# Patient Record
Sex: Female | Born: 1935 | Race: White | Hispanic: No | Marital: Married | Smoking: Never smoker
Health system: Southern US, Community
[De-identification: ages and names within clinical notes are randomized; demographics above are authoritative.]

## PROBLEM LIST (undated history)

## (undated) DIAGNOSIS — R55 Syncope and collapse: Secondary | ICD-10-CM

## (undated) DIAGNOSIS — N189 Chronic kidney disease, unspecified: Secondary | ICD-10-CM

## (undated) DIAGNOSIS — R569 Unspecified convulsions: Secondary | ICD-10-CM

## (undated) DIAGNOSIS — N183 Chronic kidney disease, stage 3 unspecified: Secondary | ICD-10-CM

## (undated) DIAGNOSIS — E119 Type 2 diabetes mellitus without complications: Secondary | ICD-10-CM

## (undated) DIAGNOSIS — I10 Essential (primary) hypertension: Secondary | ICD-10-CM

## (undated) DIAGNOSIS — R011 Cardiac murmur, unspecified: Secondary | ICD-10-CM

## (undated) DIAGNOSIS — I350 Nonrheumatic aortic (valve) stenosis: Secondary | ICD-10-CM

## (undated) DIAGNOSIS — I639 Cerebral infarction, unspecified: Secondary | ICD-10-CM

## (undated) DIAGNOSIS — I509 Heart failure, unspecified: Secondary | ICD-10-CM

## (undated) DIAGNOSIS — R32 Unspecified urinary incontinence: Secondary | ICD-10-CM

## (undated) DIAGNOSIS — E039 Hypothyroidism, unspecified: Secondary | ICD-10-CM

## (undated) HISTORY — DX: Syncope and collapse: R55

## (undated) HISTORY — DX: Type 2 diabetes mellitus without complications: E11.9

## (undated) HISTORY — DX: Cardiac murmur, unspecified: R01.1

## (undated) HISTORY — DX: Heart failure, unspecified: I50.9

## (undated) HISTORY — DX: Essential (primary) hypertension: I10

## (undated) HISTORY — DX: Cerebral infarction, unspecified: I63.9

## (undated) HISTORY — DX: Chronic kidney disease, unspecified: N18.9

---

## 1999-11-03 ENCOUNTER — Encounter: Admission: RE | Admit: 1999-11-03 | Discharge: 1999-11-03 | Payer: Self-pay | Admitting: Neurosurgery

## 1999-11-03 ENCOUNTER — Encounter: Payer: Self-pay | Admitting: Neurosurgery

## 1999-11-12 ENCOUNTER — Encounter: Payer: Self-pay | Admitting: Neurosurgery

## 1999-11-16 ENCOUNTER — Encounter: Payer: Self-pay | Admitting: Neurosurgery

## 1999-11-16 ENCOUNTER — Ambulatory Visit (HOSPITAL_COMMUNITY): Admission: RE | Admit: 1999-11-16 | Discharge: 1999-11-17 | Payer: Self-pay | Admitting: Neurosurgery

## 1999-11-29 ENCOUNTER — Encounter: Admission: RE | Admit: 1999-11-29 | Discharge: 1999-11-29 | Payer: Self-pay | Admitting: Neurosurgery

## 1999-11-29 ENCOUNTER — Encounter: Payer: Self-pay | Admitting: Neurosurgery

## 2000-02-04 ENCOUNTER — Encounter: Admission: RE | Admit: 2000-02-04 | Discharge: 2000-02-04 | Payer: Self-pay | Admitting: Neurosurgery

## 2000-02-04 ENCOUNTER — Encounter: Payer: Self-pay | Admitting: Neurosurgery

## 2000-02-28 ENCOUNTER — Encounter: Payer: Self-pay | Admitting: Neurosurgery

## 2000-02-28 ENCOUNTER — Encounter: Admission: RE | Admit: 2000-02-28 | Discharge: 2000-02-28 | Payer: Self-pay | Admitting: Neurosurgery

## 2000-04-19 ENCOUNTER — Encounter: Admission: RE | Admit: 2000-04-19 | Discharge: 2000-04-19 | Payer: Self-pay | Admitting: Neurosurgery

## 2000-04-19 ENCOUNTER — Encounter: Payer: Self-pay | Admitting: Neurosurgery

## 2000-10-13 ENCOUNTER — Encounter: Payer: Self-pay | Admitting: Neurosurgery

## 2000-10-13 ENCOUNTER — Encounter: Admission: RE | Admit: 2000-10-13 | Discharge: 2000-10-13 | Payer: Self-pay | Admitting: Neurosurgery

## 2017-03-29 DIAGNOSIS — I350 Nonrheumatic aortic (valve) stenosis: Secondary | ICD-10-CM

## 2017-03-29 DIAGNOSIS — J9601 Acute respiratory failure with hypoxia: Secondary | ICD-10-CM

## 2017-03-29 DIAGNOSIS — I480 Paroxysmal atrial fibrillation: Secondary | ICD-10-CM

## 2017-03-29 DIAGNOSIS — G40501 Epileptic seizures related to external causes, not intractable, with status epilepticus: Secondary | ICD-10-CM | POA: Diagnosis not present

## 2017-03-29 DIAGNOSIS — N3000 Acute cystitis without hematuria: Secondary | ICD-10-CM

## 2017-03-29 DIAGNOSIS — R569 Unspecified convulsions: Secondary | ICD-10-CM | POA: Diagnosis not present

## 2017-03-30 DIAGNOSIS — G40501 Epileptic seizures related to external causes, not intractable, with status epilepticus: Secondary | ICD-10-CM | POA: Diagnosis not present

## 2017-03-30 DIAGNOSIS — N3000 Acute cystitis without hematuria: Secondary | ICD-10-CM | POA: Diagnosis not present

## 2017-03-30 DIAGNOSIS — J9601 Acute respiratory failure with hypoxia: Secondary | ICD-10-CM | POA: Diagnosis not present

## 2017-03-30 DIAGNOSIS — I4891 Unspecified atrial fibrillation: Secondary | ICD-10-CM | POA: Diagnosis not present

## 2017-03-30 DIAGNOSIS — I350 Nonrheumatic aortic (valve) stenosis: Secondary | ICD-10-CM | POA: Diagnosis not present

## 2017-03-30 DIAGNOSIS — I480 Paroxysmal atrial fibrillation: Secondary | ICD-10-CM | POA: Diagnosis not present

## 2017-03-30 DIAGNOSIS — I359 Nonrheumatic aortic valve disorder, unspecified: Secondary | ICD-10-CM | POA: Diagnosis not present

## 2017-03-30 DIAGNOSIS — R55 Syncope and collapse: Secondary | ICD-10-CM

## 2017-03-30 DIAGNOSIS — R569 Unspecified convulsions: Secondary | ICD-10-CM | POA: Diagnosis not present

## 2017-03-31 DIAGNOSIS — I4891 Unspecified atrial fibrillation: Secondary | ICD-10-CM | POA: Diagnosis not present

## 2017-03-31 DIAGNOSIS — I359 Nonrheumatic aortic valve disorder, unspecified: Secondary | ICD-10-CM | POA: Diagnosis not present

## 2017-03-31 DIAGNOSIS — R569 Unspecified convulsions: Secondary | ICD-10-CM | POA: Diagnosis not present

## 2017-03-31 DIAGNOSIS — R55 Syncope and collapse: Secondary | ICD-10-CM | POA: Diagnosis not present

## 2017-03-31 DIAGNOSIS — G40501 Epileptic seizures related to external causes, not intractable, with status epilepticus: Secondary | ICD-10-CM | POA: Diagnosis not present

## 2017-03-31 DIAGNOSIS — N3000 Acute cystitis without hematuria: Secondary | ICD-10-CM | POA: Diagnosis not present

## 2017-03-31 DIAGNOSIS — J9601 Acute respiratory failure with hypoxia: Secondary | ICD-10-CM | POA: Diagnosis not present

## 2017-04-01 DIAGNOSIS — R569 Unspecified convulsions: Secondary | ICD-10-CM | POA: Diagnosis not present

## 2017-04-01 DIAGNOSIS — I4891 Unspecified atrial fibrillation: Secondary | ICD-10-CM | POA: Diagnosis not present

## 2017-04-01 DIAGNOSIS — J9601 Acute respiratory failure with hypoxia: Secondary | ICD-10-CM | POA: Diagnosis not present

## 2017-04-01 DIAGNOSIS — R55 Syncope and collapse: Secondary | ICD-10-CM | POA: Diagnosis not present

## 2017-04-01 DIAGNOSIS — N3000 Acute cystitis without hematuria: Secondary | ICD-10-CM | POA: Diagnosis not present

## 2017-04-01 DIAGNOSIS — G40501 Epileptic seizures related to external causes, not intractable, with status epilepticus: Secondary | ICD-10-CM | POA: Diagnosis not present

## 2017-04-01 DIAGNOSIS — I359 Nonrheumatic aortic valve disorder, unspecified: Secondary | ICD-10-CM | POA: Diagnosis not present

## 2017-04-24 ENCOUNTER — Telehealth: Payer: Self-pay | Admitting: Cardiovascular Disease

## 2017-04-24 NOTE — Telephone Encounter (Signed)
New message    Debbie from [facility] called to schedule TAVR consult with Dr Excell Seltzerooper, states referral  from [facility]. [place]umont[place]bie [phone]

## 2017-04-24 NOTE — Telephone Encounter (Signed)
I spoke with Alicia [name] and she has an order for TAVR consult with Dr Excell Seltzerooper per DC paperwork at [facility].  I have scheduled an apt on 3/27.  Alicia BlaseDebbie will fax me a copy of what she has from the hospital for review.

## 2017-04-24 NOTE — Telephone Encounter (Signed)
Left message on voicemail for Debbie to return my call.

## 2017-04-25 NOTE — Telephone Encounter (Signed)
ROI faxed to [facility] to obtain all records from hospitalization.

## 2017-04-26 NOTE — Telephone Encounter (Signed)
Records received from [facility].  Records faxed to chart prep at [facility] for pending apt on 3/27. Request also made to have Echo images placed on CD and Fedex to [facility] for apt.

## 2017-05-01 NOTE — Telephone Encounter (Signed)
Echo on CD received and given to Dr. Excell Seltzerooper for review.

## 2017-05-03 ENCOUNTER — Ambulatory Visit: Payer: Medicare Other | Admitting: Cardiovascular Disease

## 2017-05-03 ENCOUNTER — Encounter: Payer: Self-pay | Admitting: Cardiovascular Disease

## 2017-05-03 ENCOUNTER — Encounter (INDEPENDENT_AMBULATORY_CARE_PROVIDER_SITE_OTHER): Payer: Self-pay

## 2017-05-03 VITALS — BP 102/50 | HR 57

## 2017-05-03 DIAGNOSIS — I350 Nonrheumatic aortic (valve) stenosis: Secondary | ICD-10-CM | POA: Diagnosis not present

## 2017-05-03 DIAGNOSIS — I5033 Acute on chronic diastolic (congestive) heart failure: Secondary | ICD-10-CM | POA: Diagnosis not present

## 2017-05-03 MED ORDER — FUROSEMIDE 40 MG PO TABS
40.0000 mg | ORAL_TABLET | Freq: Every day | ORAL | 3 refills | Status: DC
Start: 2017-05-03 — End: 2017-09-07

## 2017-05-03 NOTE — Progress Notes (Signed)
Cardiology Office Note Date:  05/03/2017   ID:  Alicia [name], DOB 1934-04-04, MRN [id number]  PCP:  Gordan Payment., MD  Cardiologist:  Tonny Bollman, MD    Chief Complaint  Patient presents with  . Shortness of Breath     History of Present Illness: Alicia [name] is a 83 y.o. female who presents for evaluation of aortic stenosis.  The patient reports a long-standing heart murmur.  States that she was followed by a cardiologist in [place] several years ago but cannot recall who she saw.  She has not been physically active in quite some time.  On March 30, 2017 she was in the bathroom when she collapsed to the floor.  She does not recall the details of the episode and specifically does not remember any prodromal symptoms.  She had apparently been dealing with a gastrointestinal illness for a few days when this occurred.  EMS was called and she was taken to [facility] where she was noted to have mild acute kidney injury.  She was felt to have dehydration and was given IV fluids.  She was noted to have a heart murmur again and underwent an echocardiogram demonstrating normal LV systolic function with an ejection fraction of 65%, diastolic dysfunction, mild pulmonary hypertension with an estimated RV systolic pressure of 40 mmHg, and a calcified aortic valve with Doppler findings suggestive of moderate aortic stenosis with a mean transvalvular gradient of 33 mmHg and aortic valve dimensionless index of 0.35.  After her hospitalization, she was discharged to skilled nursing for rehabilitation.  Within 24 hours the patient had an episode of unresponsiveness.  Once she became more responsive she was confused and agitated.  She was again taken back to the hospital where she had a witnessed seizure and required intubation and mechanical ventilation.  She was transferred to [facility] where she spent approximately 2 weeks.  A PEG tube was placed.  Since hospital discharge  she has been back in skilled nursing where she remains.  The patient is here with her daughter-in-law today.  She is in a wheelchair and remains too weak to stand or walk on her own.  Even before her hospitalization she used a walker for ambulation.  The patient complains of shortness of breath with any activity.  She also has some shortness of breath in the mornings.  She denies recurrent lightheadedness or syncope.  She denies chest pain.  She does complain of leg swelling left greater than right.  No clear symptoms of orthopnea or PND.   Past Medical History:  Diagnosis Date  . CHF (congestive heart failure) (HCC)    Diastolic CHF, aortic valve disease  . Chronic kidney disease   . Diabetes mellitus without complication (HCC)    Type II  DM  . Heart murmur   . Hypertension   . Stroke (HCC)   . Syncope and collapse    occurred in setting of GI illness, UTI, seizure     History reviewed. No pertinent surgical history.  Current Outpatient Medications  Medication Sig Dispense Refill  . allopurinol (ZYLOPRIM) 100 MG tablet Take 100 mg by mouth daily.    Marland Kitchen ALPRAZolam (XANAX) 1 MG tablet Take 9.5 tablets by mouth 2 (two) times daily.    Marland Kitchen apixaban (ELIQUIS) 5 MG TABS tablet Take 5 mg by mouth 2 (two) times daily.    Marland Kitchen aspirin 81 MG chewable tablet Chew 81 mg by mouth daily.    Marland Kitchen  atorvastatin (LIPITOR) 40 MG tablet Take 40 mg by mouth daily.    . bethanechol (URECHOLINE) 25 MG tablet Take 25 mg by mouth 3 (three) times daily.    . felodipine (PLENDIL) 5 MG 24 hr tablet Take 5 mg by mouth daily.    . furosemide (LASIX) 40 MG tablet Take 1 tablet (40 mg total) by mouth daily. 90 tablet 3  . glipiZIDE (GLUCOTROL) 10 MG tablet Take 10 mg by mouth 2 (two) times daily before a meal.    . GLUCERNA (GLUCERNA) LIQD Take 237 mLs by mouth 2 (two) times daily between meals.    . levETIRAcetam (KEPPRA) 750 MG tablet Take 750 mg by mouth 2 (two) times daily.    Marland Kitchen. levothyroxine (SYNTHROID, LEVOTHROID)  75 MCG tablet Take 1 tablet by mouth daily.    . Melatonin 3 MG TABS Take 1 tablet by mouth at bedtime.    . metFORMIN (GLUCOPHAGE) 500 MG tablet Take 500 mg by mouth 2 (two) times daily with a meal.    . metoprolol tartrate (LOPRESSOR) 25 MG tablet Take 12.5 mg by mouth 2 (two) times daily.    Marland Kitchen. oxybutynin (DITROPAN) 5 MG tablet Take 5 mg by mouth 2 (two) times daily.    . pioglitazone (ACTOS) 15 MG tablet Take 15 mg by mouth daily.    . saxagliptin HCl (ONGLYZA) 5 MG TABS tablet Take 5 mg by mouth daily.     No current facility-administered medications for this visit.     Allergies:   Patient has no allergy information on record.   Social History:  The patient  reports that she has never smoked. She has never used smokeless tobacco. She reports that she does not drink alcohol or use drugs.   Family History:  The patient's family history includes Aneurysm in her sister; CAD in her brother; Diabetes in her sister; Heart attack in her mother; Hyperlipidemia in her son; Hypertension in her son; Stroke in her sister.    ROS:  Please see the history of present illness.  Otherwise, review of systems is positive for generalized weakness, leg swelling, chills, abdominal pain, depression, easy bruising, excessive fatigue, snoring, anxiety, balance problems.  All other systems are reviewed and negative.    PHYSICAL EXAM: VS:  BP (!) 102/50   Pulse (!) 57   SpO2 98%  , BMI There is no height or weight on file to calculate BMI. GEN: elderly woman in wheelchair, in no acute distress  HEENT: normal  Neck: no JVD, no masses. BL carotid bruits Cardiac: RRR with grade 3/6 harsh mid-peaking systolic murmur, A2 present but diminished               Respiratory:  clear to auscultation bilaterally, normal work of breathing GI: soft, nontender, nondistended, + BS MS: no deformity or atrophy  Ext: 2+ left pretibial edema, 1+ right pretibial edema Skin: warm and dry, redness both lower legs Neuro:   Strength and sensation are intact Psych: euthymic mood, full affect  EKG:  EKG is not ordered today.  Recent Labs: No results found for requested labs within last 8760 hours.   Lipid Panel  No results found for: CHOL, TRIG, HDL, CHOLHDL, VLDL, LDLCALC, LDLDIRECT    Wt Readings from Last 3 Encounters:  No data found for Wt    Cardiac Studies Reviewed: Recent 2D echocardiogram with findings as outlined above.  The study is personally reviewed.  ASSESSMENT AND PLAN: 1.  Moderate-severe aortic stenosis 2. Acute on  chronic diastolic CHF, NYHA functional class 3 3. Type II DM 4. Remote stroke 5. Severe functional limitation - wheelchair bound at present 6. PEG tube - starting to progress and slowly advancing diet 7. Paroxysmal atrial fibrillation: tolerating oral anticoagulation with apixaban  The patient appears to have at least moderate aortic stenosis. I have reviewed the natural history of aortic stenosis with the patient and their family members who are present today. We have discussed the limitations of medical therapy and the poor prognosis associated with symptomatic aortic stenosis. We have reviewed potential treatment options, including palliative medical therapy, conventional surgical aortic valve replacement, and transcatheter aortic valve replacement. We discussed treatment options in the context of the patient's specific comorbid medical conditions.   Considering her recent prolonged hospitalization complicated by ventilator dependent respiratory failure and marked generalized weakness, it seems most prudent to give her some time for recovery before moving forward with any further evaluation of her valvular heart disease.  The patient is unable to stand on her own or walk.  She is slowly progressing with her diet and with physical therapy.  She does have signs of volume excess and worsened shortness of breath.  I have taken the liberty of increasing her furosemide to 40 mg daily  and checking a metabolic panel to assess renal function.  I will plan to see her back in 3 months with an echocardiogram.  At that time I will reassess her clinical progress.  She understands that at the current time she is too weak and debilitated to consider any kind of valvular intervention.  Hopefully she will make some progress with her recovery in the case that she ultimately requires intervention on her aortic stenosis.  We briefly discussed potential diagnostic testing to better evaluate aortic stenosis, and agreed that at first we should just repeat an echo in a few months before embarking on any other testing.  As part of today's evaluation, I reviewed hospital records, outside echo study, and had a lengthy discussion with the patient and her daughter-in-law regarding aortic stenosis, congestive heart failure, and management options.   Current medicines are reviewed with the patient today.  The patient does not have concerns regarding medicines.  Labs/ tests ordered today include:   Orders Placed This Encounter  Procedures  . Basic metabolic panel  . Pro b natriuretic peptide (BNP)  . ECHOCARDIOGRAM COMPLETE    Disposition:   FU 3 months with an office visit. Will arrange echo day of office visit.   Signed, Tonny Bollman, MD  05/03/2017 12:01 PM    [facility] [address] [place], [place], [place]  [address] Phone: [phone]; Fax: [phone]

## 2017-05-03 NOTE — Patient Instructions (Addendum)
Medication Instructions:  1) INCREASE LASIX to 40 mg daily  Labwork: TODAY: BMET, BNP  Testing/Procedures: Your provider has requested that you have an echocardiogram. You are scheduled August 07, 2017 at 1:00PM. Please arrive by 12:30PM for this appointment. Echocardiography is a painless test that uses sound waves to create images of your heart. It provides your doctor with information about the size and shape of your heart and how well your heart's chambers and valves are working. This procedure takes approximately one hour. There are no restrictions for this procedure.  Follow-Up: You have an appointment scheduled with Dr. Excell Seltzerooper on August 07, 2017 at 2:00PM.  Any Other Special Instructions Will Be Listed Below (If Applicable).     If you need a refill on your cardiac medications before your next appointment, please call your pharmacy.

## 2017-05-04 LAB — BASIC METABOLIC PANEL
BUN / CREAT RATIO: 30 — AB (ref 12–28)
BUN: 24 mg/dL (ref 8–27)
CO2: 33 mmol/L — ABNORMAL HIGH (ref 20–29)
CREATININE: 0.81 mg/dL (ref 0.57–1.00)
Calcium: 8.8 mg/dL (ref 8.7–10.3)
Chloride: 96 mmol/L (ref 96–106)
GFR calc non Af Amer: 69 mL/min/{1.73_m2} (ref >59)
GFR, EST AFRICAN AMERICAN: 79 mL/min/{1.73_m2} (ref >59)
GLUCOSE: 176 mg/dL — AB (ref 65–99)
Potassium: 4.7 mmol/L (ref 3.5–5.2)
SODIUM: 143 mmol/L (ref 134–144)

## 2017-05-04 LAB — PRO B NATRIURETIC PEPTIDE: NT-Pro BNP: 1012 pg/mL — ABNORMAL HIGH (ref 0–738)

## 2017-05-22 ENCOUNTER — Telehealth: Payer: Self-pay | Admitting: Cardiovascular Disease

## 2017-05-22 NOTE — Telephone Encounter (Signed)
Lab results faxed.

## 2017-05-22 NOTE — Telephone Encounter (Signed)
New message:     [facility] calling due to pt's son giving them a ltr we sent saying we were unable to reach her about her lab results. Pt is currently in nursing facility and you can call her nurse with results or fax them to [phone].

## 2017-06-12 DIAGNOSIS — R0902 Hypoxemia: Secondary | ICD-10-CM | POA: Diagnosis not present

## 2017-06-12 DIAGNOSIS — I350 Nonrheumatic aortic (valve) stenosis: Secondary | ICD-10-CM | POA: Diagnosis not present

## 2017-06-12 DIAGNOSIS — R079 Chest pain, unspecified: Secondary | ICD-10-CM | POA: Diagnosis not present

## 2017-06-13 DIAGNOSIS — R0902 Hypoxemia: Secondary | ICD-10-CM | POA: Diagnosis not present

## 2017-06-13 DIAGNOSIS — I350 Nonrheumatic aortic (valve) stenosis: Secondary | ICD-10-CM | POA: Diagnosis not present

## 2017-06-13 DIAGNOSIS — R079 Chest pain, unspecified: Secondary | ICD-10-CM | POA: Diagnosis not present

## 2017-07-04 DIAGNOSIS — R001 Bradycardia, unspecified: Secondary | ICD-10-CM

## 2017-07-04 DIAGNOSIS — I4891 Unspecified atrial fibrillation: Secondary | ICD-10-CM | POA: Diagnosis not present

## 2017-07-04 DIAGNOSIS — A419 Sepsis, unspecified organism: Secondary | ICD-10-CM

## 2017-07-04 DIAGNOSIS — T68XXXA Hypothermia, initial encounter: Secondary | ICD-10-CM | POA: Diagnosis not present

## 2017-07-04 DIAGNOSIS — E119 Type 2 diabetes mellitus without complications: Secondary | ICD-10-CM

## 2017-07-04 DIAGNOSIS — N179 Acute kidney failure, unspecified: Secondary | ICD-10-CM | POA: Diagnosis not present

## 2017-07-05 DIAGNOSIS — T68XXXA Hypothermia, initial encounter: Secondary | ICD-10-CM | POA: Diagnosis not present

## 2017-07-05 DIAGNOSIS — N179 Acute kidney failure, unspecified: Secondary | ICD-10-CM | POA: Diagnosis not present

## 2017-07-05 DIAGNOSIS — I4891 Unspecified atrial fibrillation: Secondary | ICD-10-CM | POA: Diagnosis not present

## 2017-07-05 DIAGNOSIS — E119 Type 2 diabetes mellitus without complications: Secondary | ICD-10-CM | POA: Diagnosis not present

## 2017-07-05 DIAGNOSIS — R001 Bradycardia, unspecified: Secondary | ICD-10-CM | POA: Diagnosis not present

## 2017-07-05 DIAGNOSIS — A419 Sepsis, unspecified organism: Secondary | ICD-10-CM | POA: Diagnosis not present

## 2017-07-05 DIAGNOSIS — R55 Syncope and collapse: Secondary | ICD-10-CM

## 2017-07-06 ENCOUNTER — Inpatient Hospital Stay (HOSPITAL_COMMUNITY): Payer: Medicare Other

## 2017-07-06 ENCOUNTER — Inpatient Hospital Stay: Payer: Self-pay

## 2017-07-06 ENCOUNTER — Inpatient Hospital Stay (HOSPITAL_COMMUNITY)
Admission: AD | Admit: 2017-07-06 | Discharge: 2017-07-11 | DRG: 291 | Disposition: A | Discharge status: Skilled Nursing Facility | Payer: Medicare Other | Source: Other Acute Inpatient Hospital | Attending: Cardiology | Admitting: Cardiology

## 2017-07-06 ENCOUNTER — Telehealth: Payer: Self-pay | Admitting: Cardiology

## 2017-07-06 DIAGNOSIS — Z833 Family history of diabetes mellitus: Secondary | ICD-10-CM | POA: Diagnosis not present

## 2017-07-06 DIAGNOSIS — I480 Paroxysmal atrial fibrillation: Secondary | ICD-10-CM | POA: Diagnosis present

## 2017-07-06 DIAGNOSIS — I080 Rheumatic disorders of both mitral and aortic valves: Secondary | ICD-10-CM | POA: Diagnosis present

## 2017-07-06 DIAGNOSIS — Z8673 Personal history of transient ischemic attack (TIA), and cerebral infarction without residual deficits: Secondary | ICD-10-CM

## 2017-07-06 DIAGNOSIS — Z7901 Long term (current) use of anticoagulants: Secondary | ICD-10-CM

## 2017-07-06 DIAGNOSIS — R001 Bradycardia, unspecified: Secondary | ICD-10-CM | POA: Diagnosis present

## 2017-07-06 DIAGNOSIS — I130 Hypertensive heart and chronic kidney disease with heart failure and stage 1 through stage 4 chronic kidney disease, or unspecified chronic kidney disease: Secondary | ICD-10-CM | POA: Diagnosis present

## 2017-07-06 DIAGNOSIS — N183 Chronic kidney disease, stage 3 unspecified: Secondary | ICD-10-CM

## 2017-07-06 DIAGNOSIS — R570 Cardiogenic shock: Principal | ICD-10-CM

## 2017-07-06 DIAGNOSIS — R54 Age-related physical debility: Secondary | ICD-10-CM | POA: Diagnosis present

## 2017-07-06 DIAGNOSIS — E1122 Type 2 diabetes mellitus with diabetic chronic kidney disease: Secondary | ICD-10-CM | POA: Diagnosis present

## 2017-07-06 DIAGNOSIS — I5032 Chronic diastolic (congestive) heart failure: Secondary | ICD-10-CM

## 2017-07-06 DIAGNOSIS — Z823 Family history of stroke: Secondary | ICD-10-CM

## 2017-07-06 DIAGNOSIS — I371 Nonrheumatic pulmonary valve insufficiency: Secondary | ICD-10-CM | POA: Diagnosis not present

## 2017-07-06 DIAGNOSIS — N179 Acute kidney failure, unspecified: Secondary | ICD-10-CM | POA: Diagnosis not present

## 2017-07-06 DIAGNOSIS — R0602 Shortness of breath: Secondary | ICD-10-CM

## 2017-07-06 DIAGNOSIS — E119 Type 2 diabetes mellitus without complications: Secondary | ICD-10-CM | POA: Diagnosis not present

## 2017-07-06 DIAGNOSIS — A419 Sepsis, unspecified organism: Secondary | ICD-10-CM | POA: Diagnosis not present

## 2017-07-06 DIAGNOSIS — Z7984 Long term (current) use of oral hypoglycemic drugs: Secondary | ICD-10-CM | POA: Diagnosis not present

## 2017-07-06 DIAGNOSIS — Z8249 Family history of ischemic heart disease and other diseases of the circulatory system: Secondary | ICD-10-CM | POA: Diagnosis not present

## 2017-07-06 DIAGNOSIS — R55 Syncope and collapse: Secondary | ICD-10-CM | POA: Diagnosis not present

## 2017-07-06 DIAGNOSIS — I482 Chronic atrial fibrillation: Secondary | ICD-10-CM | POA: Diagnosis not present

## 2017-07-06 DIAGNOSIS — R040 Epistaxis: Secondary | ICD-10-CM | POA: Diagnosis present

## 2017-07-06 DIAGNOSIS — G40909 Epilepsy, unspecified, not intractable, without status epilepticus: Secondary | ICD-10-CM | POA: Diagnosis present

## 2017-07-06 DIAGNOSIS — E039 Hypothyroidism, unspecified: Secondary | ICD-10-CM | POA: Diagnosis present

## 2017-07-06 DIAGNOSIS — I5033 Acute on chronic diastolic (congestive) heart failure: Secondary | ICD-10-CM | POA: Diagnosis present

## 2017-07-06 DIAGNOSIS — I361 Nonrheumatic tricuspid (valve) insufficiency: Secondary | ICD-10-CM | POA: Diagnosis not present

## 2017-07-06 DIAGNOSIS — I350 Nonrheumatic aortic (valve) stenosis: Secondary | ICD-10-CM | POA: Diagnosis not present

## 2017-07-06 DIAGNOSIS — Z7982 Long term (current) use of aspirin: Secondary | ICD-10-CM

## 2017-07-06 HISTORY — DX: Nonrheumatic aortic (valve) stenosis: I35.0

## 2017-07-06 HISTORY — DX: Unspecified urinary incontinence: R32

## 2017-07-06 HISTORY — DX: Unspecified convulsions: R56.9

## 2017-07-06 HISTORY — DX: Chronic kidney disease, stage 3 unspecified: N18.30

## 2017-07-06 HISTORY — DX: Hypothyroidism, unspecified: E03.9

## 2017-07-06 HISTORY — DX: Chronic kidney disease, stage 3 (moderate): N18.3

## 2017-07-06 LAB — URINALYSIS, ROUTINE W REFLEX MICROSCOPIC
BACTERIA UA: NONE SEEN
Bilirubin Urine: NEGATIVE
GLUCOSE, UA: 50 mg/dL — AB
Ketones, ur: NEGATIVE mg/dL
Leukocytes, UA: NEGATIVE
Nitrite: NEGATIVE
PH: 6 (ref 5.0–8.0)
Protein, ur: NEGATIVE mg/dL
SPECIFIC GRAVITY, URINE: 1.008 (ref 1.005–1.030)

## 2017-07-06 LAB — GLUCOSE, CAPILLARY: Glucose-Capillary: 212 mg/dL — ABNORMAL HIGH (ref 65–99)

## 2017-07-06 LAB — CBC WITH DIFFERENTIAL/PLATELET
ABS IMMATURE GRANULOCYTES: 0 10*3/uL (ref 0.0–0.1)
BASOS ABS: 0 10*3/uL (ref 0.0–0.1)
BASOS PCT: 0 %
Eosinophils Absolute: 0 10*3/uL (ref 0.0–0.7)
Eosinophils Relative: 0 %
HCT: 33.3 % — ABNORMAL LOW (ref 36.0–46.0)
HEMOGLOBIN: 10.1 g/dL — AB (ref 12.0–15.0)
Immature Granulocytes: 1 %
LYMPHS PCT: 10 %
Lymphs Abs: 0.8 10*3/uL (ref 0.7–4.0)
MCH: 25.5 pg — AB (ref 26.0–34.0)
MCHC: 30.3 g/dL (ref 30.0–36.0)
MCV: 84.1 fL (ref 78.0–100.0)
MONO ABS: 0.6 10*3/uL (ref 0.1–1.0)
Monocytes Relative: 8 %
NEUTROS ABS: 6.6 10*3/uL (ref 1.7–7.7)
Neutrophils Relative %: 81 %
PLATELETS: 148 10*3/uL — AB (ref 150–400)
RBC: 3.96 MIL/uL (ref 3.87–5.11)
RDW: 15.9 % — ABNORMAL HIGH (ref 11.5–15.5)
WBC: 8.1 10*3/uL (ref 4.0–10.5)

## 2017-07-06 LAB — COOXEMETRY PANEL
CARBOXYHEMOGLOBIN: 1.1 % (ref 0.5–1.5)
METHEMOGLOBIN: 1.5 % (ref 0.0–1.5)
O2 SAT: 61.8 %
TOTAL HEMOGLOBIN: 12.5 g/dL (ref 12.0–16.0)

## 2017-07-06 LAB — PROTIME-INR
INR: 1.29
PROTHROMBIN TIME: 16 s — AB (ref 11.4–15.2)

## 2017-07-06 LAB — APTT: aPTT: 41 seconds — ABNORMAL HIGH (ref 24–36)

## 2017-07-06 MED ORDER — SODIUM CHLORIDE 0.9% FLUSH
10.0000 mL | Freq: Two times a day (BID) | INTRAVENOUS | Status: DC
  Administered 2017-07-06 – 2017-07-08 (×4): 10 mL
  Administered 2017-07-09: 20 mL
  Administered 2017-07-11: 10 mL

## 2017-07-06 MED ORDER — SODIUM CHLORIDE 0.9% FLUSH: 10.0000 mL | INTRAVENOUS | Status: DC | PRN

## 2017-07-06 MED ORDER — HEPARIN (PORCINE) IN NACL 100-0.45 UNIT/ML-% IJ SOLN
800.0000 [IU]/h | INTRAMUSCULAR | Status: DC
  Administered 2017-07-06: 950 [IU]/h via INTRAVENOUS
  Filled 2017-07-06: qty 250

## 2017-07-06 MED ORDER — ACETAMINOPHEN 325 MG PO TABS: 650.0000 mg | ORAL_TABLET | ORAL | Status: DC | PRN

## 2017-07-06 MED ORDER — INSULIN ASPART 100 UNIT/ML ~~LOC~~ SOLN
0.0000 [IU] | Freq: Three times a day (TID) | SUBCUTANEOUS | Status: DC
  Administered 2017-07-07: 3 [IU] via SUBCUTANEOUS
  Administered 2017-07-07 – 2017-07-08 (×3): 2 [IU] via SUBCUTANEOUS
  Administered 2017-07-09: 5 [IU] via SUBCUTANEOUS
  Administered 2017-07-09: 1 [IU] via SUBCUTANEOUS
  Administered 2017-07-09: 2 [IU] via SUBCUTANEOUS
  Administered 2017-07-10: 3 [IU] via SUBCUTANEOUS
  Administered 2017-07-10: 2 [IU] via SUBCUTANEOUS
  Administered 2017-07-10: 1 [IU] via SUBCUTANEOUS
  Administered 2017-07-11 (×2): 2 [IU] via SUBCUTANEOUS

## 2017-07-06 MED ORDER — FUROSEMIDE 10 MG/ML IJ SOLN
20.0000 mg | Freq: Two times a day (BID) | INTRAMUSCULAR | Status: DC
  Administered 2017-07-06 – 2017-07-07 (×2): 20 mg via INTRAVENOUS
  Filled 2017-07-06 (×2): qty 2

## 2017-07-06 MED ORDER — DOBUTAMINE IN D5W 4-5 MG/ML-% IV SOLN
5.0000 ug/kg/min | INTRAVENOUS | Status: DC
  Administered 2017-07-06: 5 ug/kg/min via INTRAVENOUS
  Filled 2017-07-06: qty 250

## 2017-07-06 MED ORDER — ATORVASTATIN CALCIUM 40 MG PO TABS
40.0000 mg | ORAL_TABLET | Freq: Every day | ORAL | Status: DC
  Administered 2017-07-07 – 2017-07-11 (×5): 40 mg via ORAL
  Filled 2017-07-06 (×5): qty 1

## 2017-07-06 MED ORDER — INSULIN ASPART 100 UNIT/ML ~~LOC~~ SOLN
0.0000 [IU] | Freq: Every day | SUBCUTANEOUS | Status: DC
  Administered 2017-07-06: 2 [IU] via SUBCUTANEOUS
  Administered 2017-07-09: 5 [IU] via SUBCUTANEOUS

## 2017-07-06 MED ORDER — LEVOTHYROXINE SODIUM 75 MCG PO TABS
75.0000 ug | ORAL_TABLET | Freq: Every day | ORAL | Status: DC
  Administered 2017-07-07 – 2017-07-11 (×5): 75 ug via ORAL
  Filled 2017-07-06 (×5): qty 1

## 2017-07-06 MED ORDER — LEVETIRACETAM 750 MG PO TABS
750.0000 mg | ORAL_TABLET | Freq: Two times a day (BID) | ORAL | Status: DC
  Administered 2017-07-06 – 2017-07-11 (×10): 750 mg via ORAL
  Filled 2017-07-06: qty 3
  Filled 2017-07-06 (×6): qty 1
  Filled 2017-07-06 (×4): qty 3

## 2017-07-06 MED ORDER — ONDANSETRON HCL 4 MG/2ML IJ SOLN: 4.0000 mg | Freq: Four times a day (QID) | INTRAMUSCULAR | Status: DC | PRN

## 2017-07-06 MED ORDER — DOBUTAMINE IN D5W 4-5 MG/ML-% IV SOLN: 2.5000 ug/kg/min | INTRAVENOUS | Status: DC

## 2017-07-06 MED ORDER — ALPRAZOLAM 0.25 MG PO TABS
0.2500 mg | ORAL_TABLET | Freq: Two times a day (BID) | ORAL | Status: DC | PRN
  Administered 2017-07-06 – 2017-07-11 (×9): 0.25 mg via ORAL
  Filled 2017-07-06 (×9): qty 1

## 2017-07-06 MED ORDER — OXYBUTYNIN CHLORIDE 5 MG PO TABS
5.0000 mg | ORAL_TABLET | Freq: Every day | ORAL | Status: DC
  Administered 2017-07-07 – 2017-07-11 (×5): 5 mg via ORAL
  Filled 2017-07-06 (×5): qty 1

## 2017-07-06 MED ORDER — CHLORHEXIDINE GLUCONATE CLOTH 2 % EX PADS
6.0000 | MEDICATED_PAD | Freq: Every day | CUTANEOUS | Status: DC
  Administered 2017-07-07 – 2017-07-11 (×4): 6 via TOPICAL

## 2017-07-06 MED ORDER — NITROGLYCERIN 0.4 MG SL SUBL: 0.4000 mg | SUBLINGUAL_TABLET | SUBLINGUAL | Status: DC | PRN

## 2017-07-06 MED ORDER — ASPIRIN 81 MG PO CHEW
81.0000 mg | CHEWABLE_TABLET | Freq: Every day | ORAL | Status: DC
  Administered 2017-07-07: 81 mg via ORAL
  Filled 2017-07-06: qty 1

## 2017-07-06 NOTE — Progress Notes (Signed)
Peripherally Inserted Central Catheter/Midline Placement  The IV Nurse has discussed with the patient and/or persons authorized to consent for the patient, the purpose of this procedure and the potential benefits and risks involved with this procedure.  The benefits include less needle sticks, lab draws from the catheter, and the patient may be discharged home with the catheter. Risks include, but not limited to, infection, bleeding, blood clot (thrombus formation), and puncture of an artery; nerve damage and irregular heartbeat and possibility to perform a PICC exchange if needed/ordered by physician.  Alternatives to this procedure were also discussed.  Bard Power PICC patient education guide, fact sheet on infection prevention and patient information card has been provided to patient /or left at bedside.    PICC/Midline Placement Documentation  PICC Triple Lumen 07/06/17 PICC Right Brachial 40 cm 0 cm (Active)  Indication for Insertion or Continuance of Line Vasoactive infusions 07/06/2017 10:05 PM  Exposed Catheter (cm) 0 cm 07/06/2017 10:05 PM  Site Assessment Clean;Dry;Intact 07/06/2017 10:05 PM  Lumen #1 Status Blood return noted;Flushed;Saline locked 07/06/2017 10:05 PM  Lumen #2 Status Blood return noted;Flushed;Saline locked 07/06/2017 10:05 PM  Lumen #3 Status Blood return noted;Flushed;Saline locked 07/06/2017 10:05 PM  Dressing Type Transparent;Occlusive 07/06/2017 10:05 PM  Dressing Status Clean;Dry;Intact;Antimicrobial disc in place 07/06/2017 10:05 PM  Dressing Intervention New dressing 07/06/2017 10:05 PM  Dressing Change Due 07/13/17 07/06/2017 10:05 PM       Netta Corrigan L 07/06/2017, 10:19 PM

## 2017-07-06 NOTE — H&P (Signed)
Cardiology Consultation:   Patient ID: Alicia [name]; [id number]; 1934/10/13   Admit date: 07/06/2017 Date of Consult: 07/06/2017  Primary Care Provider: Raina Mina., MD Primary Cardiologist: Burt Knack   Patient Profile:   Alicia [name] is a 83 y.o. female with a hx of  PAF, aortic stenosis, who is transferred from [facility] for hypotension and SOB.   History of Present Illness:    83 yo female history of moderate aortic stenosis, PAF, seizure disorder, DM2, CVA, hypothyroidism admitted 07/04/17 with hypotension with reported SBP in the 70s and heart rates in the 40s. From notes patient was initialy hypothermic to 88.2, CXR with infiltrate vs atelectasis, normal calcitonin. This was not thought to be sepsis given normal calcitonin level, negative UA, nonacute CXR, and negative blood cx's at 24 hrs. Was transiently on broad spectrum abx but stopped.  From notes cardiology was consulted and recommended starting dobutamine which improved bp's and heart rates significantly, there is concern about possible cardiogenic shock.   The patient herself reports she presented with gradual progressing SOB and LE edema. Denies any chest pain, no fevers or chills. No cough up until this morning. Has had some dysuria but UA was benign at OSH.   Other history Feb 2019 episode of syncope in setting of GI illness. During ER evaluation found to have AKI, thought to be related to dehydration at [facility]. Echo at that time shoed LVEF 36%, diastolic dysfunction, AS with mean grad 33 and dimensionless index 0.35. She was discharged to rehab, however had an episode of unresponsiveness and taken back to hospital. Witnessed seizure at the time, required intubation and transferred to [facility] at that time and later discharged. Seen by Dr Burt Knack in valve clinic, plan was for continued recovery from her severe deconditioning related to prolonged hospital stays and continued monitoring of her moderate  AS.    Echo 03/2017 supports moderate AS at that time, will need repeat echo to evaluate.  Labs Hgb 9.1 Hct 28.9 Plt 111 WBC 4.7 K 4.7 Cr 1.3 BUN 59 INR 1.1 AST 63 ALT 62 Alk phos 61 UA neg nitr neg LE no WBC few bacteria ABG 7.39 pco2 58 PO2 63 CXR right basilar atelectasis no focal abnormality    Past Medical History:  Diagnosis Date  . CHF (congestive heart failure) (HCC)    Diastolic CHF, aortic valve disease  . Chronic kidney disease   . Diabetes mellitus without complication ([place])    Type II  DM  . Heart murmur   . Hypertension   . Stroke ([place])   . Syncope and collapse    occurred in setting of GI illness, UTI, seizure     No past surgical history on file.    Inpatient Medications: Scheduled Meds: . aspirin  81 mg Oral Daily  . atorvastatin  40 mg Oral Daily  . furosemide  20 mg Intravenous Q12H  . insulin aspart  0-5 Units Subcutaneous QHS  . [START ON 07/07/2017] insulin aspart  0-9 Units Subcutaneous TID WC  . levETIRAcetam  750 mg Oral BID  . levothyroxine  75 mcg Oral Daily  . oxybutynin  5 mg Oral BID   Continuous Infusions: . DOBUTamine     PRN Meds: acetaminophen, ALPRAZolam, nitroGLYCERIN, ondansetron (ZOFRAN) IV  Allergies:   Not on File  Social History:   Social History   Socioeconomic History  . Marital status: Married    Spouse name: Not on file  . Number of children: Not  on file  . Years of education: Not on file  . Highest education level: Not on file  Occupational History  . Not on file  Social Needs  . Financial resource strain: Not on file  . Food insecurity:    Worry: Not on file    Inability: Not on file  . Transportation needs:    Medical: Not on file    Non-medical: Not on file  Tobacco Use  . Smoking status: Never Smoker  . Smokeless tobacco: Never Used  Substance and Sexual Activity  . Alcohol use: Never    Frequency: Never  . Drug use: Never  . Sexual activity: Not on file  Lifestyle  . Physical activity:     Days per week: Not on file    Minutes per session: Not on file  . Stress: Not on file  Relationships  . Social connections:    Talks on phone: Not on file    Gets together: Not on file    Attends religious service: Not on file    Active member of club or organization: Not on file    Attends meetings of clubs or organizations: Not on file    Relationship status: Not on file  . Intimate partner violence:    Fear of current or ex partner: Not on file    Emotionally abused: Not on file    Physically abused: Not on file    Forced sexual activity: Not on file  Other Topics Concern  . Not on file  Social History Narrative  . Not on file    Family History:    Family History  Problem Relation Age of Onset  . Heart attack Mother   . Diabetes Sister   . CAD Brother   . Hyperlipidemia Son   . Hypertension Son   . Stroke Sister   . Aneurysm Sister      ROS:  Please see the history of present illness.  All other ROS reviewed and negative.     Physical Exam/Data:   Vitals:   07/06/17 2016  Temp: 97.6 F (36.4 C)  TempSrc: Oral  Weight: 204 lb 2.3 oz (92.6 kg)  Height: '5\' 4"'$  (1.626 m)    Intake/Output Summary (Last 24 hours) at 07/06/2017 2034 Last data filed at 07/06/2017 1900 Gross per 24 hour  Intake -  Output 600 ml  Net -600 ml   Filed Weights   07/06/17 2016  Weight: 204 lb 2.3 oz (92.6 kg)   Body mass index is 35.04 kg/m.  General:  Well nourished, well developed, in no acute distress HEENT: normal Lymph: no adenopathy Neck: no JVD Endocrine:  No thryomegaly Cardiac:  Irreg, 3/6 systolic murmur rusb, mildly elevated JVD.  Lungs:  Decreased breath sounds right base, mild bialteral crackles.  Abd: soft, nontender, no hepatomegaly  Ext: no edema Musculoskeletal:  No deformities, BUE and BLE strength normal and equal Skin: warm and dry  Neuro:  CNs 2-12 intact, no focal abnormalities noted Psych:  Normal affect    Laboratory Data:  ChemistryNo results  for input(s): NA, K, CL, CO2, GLUCOSE, BUN, CREATININE, CALCIUM, GFRNONAA, GFRAA, ANIONGAP in the last 168 hours.  No results for input(s): PROT, ALBUMIN, AST, ALT, ALKPHOS, BILITOT in the last 168 hours. HematologyNo results for input(s): WBC, RBC, HGB, HCT, MCV, MCH, MCHC, RDW, PLT in the last 168 hours. Cardiac EnzymesNo results for input(s): TROPONINI in the last 168 hours. No results for input(s): TROPIPOC in the last 168 hours.  BNPNo results for input(s): BNP, PROBNP in the last 168 hours.  DDimer No results for input(s): DDIMER in the last 168 hours.  Radiology/Studies:  No results found.  Assessment and Plan:   1. Hypotension/Shock - initial workup at OSH for hypotension, hypothermia, hypoxia, and bradycardia. Several SIRS criteria but no clear source of infection. Cardiogenic shock became suspected. Started emperically on dobutamine with improved bp's and heart rates.  - we will repeat infectious workup here including cx's and procalcitonin level. Also will need to f/u on update cultures from [facility] which only reported at 24 hrs (she did receive abx for a few days so cultures here may be affected) - will place PICC line, check central line venous sat and CVP monitoring. Repeat echo - continue dobutamine at 5 mcg/kg/min, bps and heart rates are within normal limits  2. Aortic stenosis - moderate by criteria in 04/2017 echo. Repeat echo in setting of hypotension. Valve by criteria in 04/2017 would not cause this presentation, would have to significantly have progressed.   3. Acute on chronic diastolic HF - remains volume overloaded, gentle IV diuresis given recent hypotension and also with AS - check CVPs  4. DM2 - SSI  5. PAF - bradycardic on admission. Hold all av nodal agents - rates within normal limits currently despite being on dobutamine - hold eliquis, start hep gtt in case invasive procedures are indicated  6. Hx of seizures - continue keppra    For questions or  updates, please contact [facility] Please consult [email or web address] for contact info under Cardiology/STEMI.   Merrily Pew, MD  07/06/2017 8:34 PM

## 2017-07-06 NOTE — Progress Notes (Addendum)
ANTICOAGULATION CONSULT NOTE - Initial Consult  Pharmacy Consult for heparin Indication: chest pain/ACS  Not on File  Patient Measurements: Height:  (162.6 cm) Weight: 204 lb 2.3 oz (92.6 kg) IBW/kg (Calculated) : 54.7 Heparin Dosing Weight: 75.6 kg  Vital Signs: Temp: 97.6 F (36.4 C) (05/30 2016) Temp Source: Oral (05/30 2016)  Labs: No results for input(s): HGB, HCT, PLT, APTT, LABPROT, INR, HEPARINUNFRC, HEPRLOWMOCWT, CREATININE, CKTOTAL, CKMB, TROPONINI in the last 72 hours.  CrCl cannot be calculated (Patient's most recent lab result is older than the maximum 21 days allowed.).   Medical History: Past Medical History:  Diagnosis Date  . CHF (congestive heart failure) (HCC)    Diastolic CHF, aortic valve disease  . Chronic kidney disease   . Diabetes mellitus without complication (HCC)    Type II  DM  . Heart murmur   . Hypertension   . Stroke (HCC)   . Syncope and collapse    occurred in setting of GI illness, UTI, seizure     Medications:  Scheduled:  . [START ON 07/07/2017] aspirin  81 mg Oral Daily  . [START ON 07/07/2017] atorvastatin  40 mg Oral Daily  . furosemide  20 mg Intravenous Q12H  . insulin aspart  0-5 Units Subcutaneous QHS  . [START ON 07/07/2017] insulin aspart  0-9 Units Subcutaneous TID WC  . levETIRAcetam  750 mg Oral BID  . [START ON 07/07/2017] levothyroxine  75 mcg Oral QAC breakfast  . [START ON 07/07/2017] oxybutynin  5 mg Oral Daily    Assessment: 82 yof with history of paroxysmal atrial fibrillation on apixaban PTA (last dose at OSH documented on 5/30@0825 ) presented with hypotension and started on low-dose dobutamine. Transferred to [facility] for possible TAVR versus ablation work-up.   Given timing of last apixaban dose, will monitor with both anti-Xa and aPTT labs until they correlate. No signs/symptoms of bleeding. CBC stable at OSH.  Goal of Therapy:  Heparin level 0.3-0.7 units/ml aPTT 66-102 seconds Monitor platelets  by anticoagulation protocol: Yes   Plan:  Start heparin infusion at 950 units/hr at 5/30@2100  Check anti-Xa and aPTT level in 8 hours and daily while on heparin Continue to monitor H&H and platelets  Girard Cooter, PharmD Clinical Pharmacist  Pager: [phone] Phone: [phone] 07/06/2017,8:50 PM

## 2017-07-07 ENCOUNTER — Inpatient Hospital Stay (HOSPITAL_COMMUNITY): Payer: Medicare Other

## 2017-07-07 ENCOUNTER — Encounter (HOSPITAL_COMMUNITY): Payer: Self-pay | Admitting: Adult Health

## 2017-07-07 ENCOUNTER — Other Ambulatory Visit: Payer: Self-pay

## 2017-07-07 DIAGNOSIS — I482 Chronic atrial fibrillation: Secondary | ICD-10-CM

## 2017-07-07 DIAGNOSIS — I361 Nonrheumatic tricuspid (valve) insufficiency: Secondary | ICD-10-CM

## 2017-07-07 DIAGNOSIS — I350 Nonrheumatic aortic (valve) stenosis: Secondary | ICD-10-CM

## 2017-07-07 DIAGNOSIS — I371 Nonrheumatic pulmonary valve insufficiency: Secondary | ICD-10-CM

## 2017-07-07 DIAGNOSIS — R001 Bradycardia, unspecified: Secondary | ICD-10-CM

## 2017-07-07 LAB — GLUCOSE, CAPILLARY
Glucose-Capillary: 128 mg/dL — ABNORMAL HIGH (ref 65–99)
Glucose-Capillary: 187 mg/dL — ABNORMAL HIGH (ref 65–99)
Glucose-Capillary: 117 mg/dL — ABNORMAL HIGH (ref 65–99)

## 2017-07-07 LAB — COMPREHENSIVE METABOLIC PANEL
ALBUMIN: 2.8 g/dL — AB (ref 3.5–5.0)
ALT: 48 U/L (ref 14–54)
AST: 47 U/L — AB (ref 15–41)
Alkaline Phosphatase: 61 U/L (ref 38–126)
Anion gap: 11 (ref 5–15)
BUN: 39 mg/dL — AB (ref 6–20)
CHLORIDE: 98 mmol/L — AB (ref 101–111)
CO2: 32 mmol/L (ref 22–32)
Calcium: 9.7 mg/dL (ref 8.9–10.3)
Creatinine, Ser: 1.07 mg/dL — ABNORMAL HIGH (ref 0.44–1.00)
GFR calc Af Amer: 55 mL/min — ABNORMAL LOW (ref >60)
GFR calc non Af Amer: 48 mL/min — ABNORMAL LOW (ref >60)
Glucose, Bld: 212 mg/dL — ABNORMAL HIGH (ref 65–99)
POTASSIUM: 4 mmol/L (ref 3.5–5.1)
SODIUM: 141 mmol/L (ref 135–145)
Total Bilirubin: 0.5 mg/dL (ref 0.3–1.2)
Total Protein: 6.8 g/dL (ref 6.5–8.1)

## 2017-07-07 LAB — LIPID PANEL
Cholesterol: 87 mg/dL (ref 0–200)
HDL: 49 mg/dL (ref >40)
LDL CALC: 29 mg/dL (ref 0–99)
Total CHOL/HDL Ratio: 1.8 RATIO
Triglycerides: 46 mg/dL (ref <150)
VLDL: 9 mg/dL (ref 0–40)

## 2017-07-07 LAB — RESPIRATORY PANEL BY PCR
ADENOVIRUS-RVPPCR: NOT DETECTED
Bordetella pertussis: NOT DETECTED
CORONAVIRUS HKU1-RVPPCR: NOT DETECTED
CORONAVIRUS OC43-RVPPCR: NOT DETECTED
Chlamydophila pneumoniae: NOT DETECTED
Coronavirus 229E: NOT DETECTED
Coronavirus NL63: NOT DETECTED
Influenza A: NOT DETECTED
Influenza B: NOT DETECTED
METAPNEUMOVIRUS-RVPPCR: NOT DETECTED
Mycoplasma pneumoniae: NOT DETECTED
PARAINFLUENZA VIRUS 1-RVPPCR: NOT DETECTED
PARAINFLUENZA VIRUS 2-RVPPCR: NOT DETECTED
PARAINFLUENZA VIRUS 3-RVPPCR: NOT DETECTED
Parainfluenza Virus 4: NOT DETECTED
RHINOVIRUS / ENTEROVIRUS - RVPPCR: NOT DETECTED
Respiratory Syncytial Virus: NOT DETECTED

## 2017-07-07 LAB — BASIC METABOLIC PANEL
Anion gap: 9 (ref 5–15)
BUN: 31 mg/dL — AB (ref 6–20)
CHLORIDE: 100 mmol/L — AB (ref 101–111)
CO2: 34 mmol/L — ABNORMAL HIGH (ref 22–32)
CREATININE: 1.07 mg/dL — AB (ref 0.44–1.00)
Calcium: 9.7 mg/dL (ref 8.9–10.3)
GFR calc Af Amer: 55 mL/min — ABNORMAL LOW (ref >60)
GFR calc non Af Amer: 48 mL/min — ABNORMAL LOW (ref >60)
GLUCOSE: 169 mg/dL — AB (ref 65–99)
POTASSIUM: 3.9 mmol/L (ref 3.5–5.1)
Sodium: 143 mmol/L (ref 135–145)

## 2017-07-07 LAB — CBC
HEMATOCRIT: 31.5 % — AB (ref 36.0–46.0)
Hemoglobin: 9.7 g/dL — ABNORMAL LOW (ref 12.0–15.0)
MCH: 26.4 pg (ref 26.0–34.0)
MCHC: 30.8 g/dL (ref 30.0–36.0)
MCV: 85.6 fL (ref 78.0–100.0)
Platelets: 137 10*3/uL — ABNORMAL LOW (ref 150–400)
RBC: 3.68 MIL/uL — ABNORMAL LOW (ref 3.87–5.11)
RDW: 16.3 % — ABNORMAL HIGH (ref 11.5–15.5)
WBC: 7 10*3/uL (ref 4.0–10.5)

## 2017-07-07 LAB — PROCALCITONIN

## 2017-07-07 LAB — APTT
APTT: 133 s — AB (ref 24–36)
aPTT: 126 seconds — ABNORMAL HIGH (ref 24–36)

## 2017-07-07 LAB — ECHOCARDIOGRAM COMPLETE
HEIGHTINCHES: 64 in
WEIGHTICAEL: 3248.7 [oz_av]

## 2017-07-07 LAB — COOXEMETRY PANEL
Carboxyhemoglobin: 1.3 % (ref 0.5–1.5)
METHEMOGLOBIN: 1.7 % — AB (ref 0.0–1.5)
O2 Saturation: 76.6 %
Total hemoglobin: 10 g/dL — ABNORMAL LOW (ref 12.0–16.0)

## 2017-07-07 LAB — HEPARIN LEVEL (UNFRACTIONATED): Heparin Unfractionated: >2.2 IU/mL — ABNORMAL HIGH (ref 0.30–0.70)

## 2017-07-07 LAB — TROPONIN I
TROPONIN I: 0.03 ng/mL — AB (ref <0.03)
Troponin I: <0.03 ng/mL (ref <0.03)

## 2017-07-07 LAB — HEMOGLOBIN A1C
Hgb A1c MFr Bld: 7.4 % — ABNORMAL HIGH (ref 4.8–5.6)
MEAN PLASMA GLUCOSE: 165.68 mg/dL

## 2017-07-07 LAB — MAGNESIUM: MAGNESIUM: 1.3 mg/dL — AB (ref 1.7–2.4)

## 2017-07-07 LAB — BRAIN NATRIURETIC PEPTIDE: B NATRIURETIC PEPTIDE 5: 328.5 pg/mL — AB (ref 0.0–100.0)

## 2017-07-07 LAB — TSH: TSH: 1.629 u[IU]/mL (ref 0.350–4.500)

## 2017-07-07 MED ORDER — MAGNESIUM SULFATE 2 GM/50ML IV SOLN
2.0000 g | Freq: Once | INTRAVENOUS | Status: AC
  Administered 2017-07-07: 2 g via INTRAVENOUS
  Filled 2017-07-07: qty 50

## 2017-07-07 MED ORDER — FUROSEMIDE 40 MG PO TABS
40.0000 mg | ORAL_TABLET | Freq: Every day | ORAL | Status: DC
  Administered 2017-07-08 – 2017-07-11 (×4): 40 mg via ORAL
  Filled 2017-07-07 (×4): qty 1

## 2017-07-07 MED ORDER — APIXABAN 5 MG PO TABS
5.0000 mg | ORAL_TABLET | Freq: Two times a day (BID) | ORAL | Status: DC
  Administered 2017-07-07 – 2017-07-11 (×8): 5 mg via ORAL
  Filled 2017-07-07 (×8): qty 1

## 2017-07-07 MED ORDER — HEPARIN (PORCINE) IN NACL 100-0.45 UNIT/ML-% IJ SOLN: 650.0000 [IU]/h | INTRAMUSCULAR | Status: DC

## 2017-07-07 NOTE — Progress Notes (Addendum)
ANTICOAGULATION CONSULT NOTE - Follow Up Consult  Pharmacy Consult for heparin Indication: atrial fibrillation  Labs: Recent Labs    07/06/17 2110 07/07/17 0459 07/07/17 0810 07/07/17 1427  HGB 10.1*  --  9.7*  --   HCT 33.3*  --  31.5*  --   PLT 148*  --  137*  --   APTT 41* 126*  --  133*  LABPROT 16.0*  --   --   --   INR 1.29  --   --   --   HEPARINUNFRC  --  >2.20*  --   --   CREATININE 1.07*  --  1.07*  --   TROPONINI <0.03 <0.03 0.03*  --     Assessment: 83yo female on apixaban PTA on on heparin while this is on hold. -aPTT=  133 after decrease to 800 units/hr -some bleeding noted at the IV line   Goal of Therapy:  Heparin level 0.3-0.7 units/ml   Plan:  -Hold heparin for 30 minutes and decrease to 650 units/hr -aPTT level in 6 hours and daily wth CBC daily  Harland German, PharmD Clinical Pharmacist Clinical phone from 8:30-4:00 is [phone] After 4pm, please call Main Rx [phone]) for assistance. 07/07/2017 3:36 PM

## 2017-07-07 NOTE — Progress Notes (Signed)
MD called about doubutamine drip and titration for MAP of <65. MD stated that he wants to keep doubutamine at . Told to call back if MAP is consistently low. Will continue to monitor and update as needed.

## 2017-07-07 NOTE — Progress Notes (Signed)
ANTICOAGULATION CONSULT NOTE - Follow Up Consult  Pharmacy Consult for heparin Indication: atrial fibrillation  Labs: Recent Labs    07/06/17 2110 07/07/17 0459  HGB 10.1*  --   HCT 33.3*  --   PLT 148*  --   APTT 41* 126*  LABPROT 16.0*  --   INR 1.29  --   CREATININE 1.07*  --   TROPONINI <0.03 <0.03    Assessment: 82yo female supratherapeutic on heparin with initial dosing while Eliquis on hold.  Goal of Therapy:  Heparin level 0.3-0.7 units/ml   Plan:  Will decrease heparin gtt by 1-2 units/kg/hr to 800 units/hr and check PTT in 8 hours.    Vernard Gambles, PharmD, BCPS  07/07/2017,6:56 AM

## 2017-07-07 NOTE — Progress Notes (Signed)
ANTICOAGULATION CONSULT NOTE  Pharmacy Consult for heparin>apixaban Indication: atrial fibrillation  Labs: Recent Labs    07/06/17 2110 07/07/17 0459 07/07/17 0810 07/07/17 1427  HGB 10.1*  --  9.7*  --   HCT 33.3*  --  31.5*  --   PLT 148*  --  137*  --   APTT 41* 126*  --  133*  LABPROT 16.0*  --   --   --   INR 1.29  --   --   --   HEPARINUNFRC  --  >2.20*  --   --   CREATININE 1.07*  --  1.07*  --   TROPONINI <0.03 <0.03 0.03*  --     Assessment: 83yo female on apixaban PTA - transitioned to heparin infusion while on hold in case any procedures were needed.  Plan to transition back to apixaban per cardiology team. PTA dose was full dose- appropriate given Scr<1.5 and weight>60 kg despite age=82. Hgb 9.7, plt 137. No s/sx of bleeding documented outside of IV line earlier.    Plan:  -Discontinue heparin infusion -Start apixaban 5 mg twice daily tonight at time of heparin infusion discontinuation -Monitor renal function, CBC, and for s/sx of bleeding  Girard CooterKimberly Perkins, PharmD Clinical Pharmacist  Pager: [phone] Phone: [phone] 07/07/2017 8:06 PM

## 2017-07-07 NOTE — Progress Notes (Signed)
Spiritual Care Consult Acknowledged. Pt. Alicia [name] is alert and warmly conversational. The reason identified on the Spiritual Care Consult was Advanced Directive Education. Alicia [name] speaks highly of the two persons who look after her. Primarily the wife of her only son whom she calls "Alicia [name]."  Pt.'s son and daughter-in-law come to hospital after 1700. From our conversation it appears that the Pt. Has a clear line of advocacy and Health Care agency. I will pass her name on to the Chaplain that regularly rounds on this floor and we will work on having a conversation with the family to further assess Advanced Directive Education needs.

## 2017-07-07 NOTE — Care Management Note (Signed)
Case Management Note  Patient Details  Name: Alicia [name] MRN: [id number] Date of Birth: 1934/05/30  Subjective/Objective:                 Spoke to patient at bedside and DIL over the phone. Patient is from home, DIL stays with her provides 9' supervision. Patient has a RW raised toilet seat and shower chair at home. She is active w [facility] services. Notified [facility] of admission.   Action/Plan:  Will need HH orders at DC, and [place] notified.   Expected Discharge Date:                  Expected Discharge Plan:  Home w Home Health Services  In-House Referral:     Discharge planning Services  CM Consult  Post Acute Care Choice:  Home Health, Resumption of Svcs/PTA Provider Choice offered to:  Patient, Adult Children  DME Arranged:    DME Agency:     HH Arranged:    HH Agency:  [facility]  Status of Service:  In process, will continue to follow  If discussed at Long Length of Stay Meetings, dates discussed:    Additional Comments:  Lawerance Sabal, RN 07/07/2017, 2:28 PM

## 2017-07-07 NOTE — Progress Notes (Signed)
  Echocardiogram 2D Echocardiogram has been performed.  Alicia [name] 07/07/2017, 2:05 PM

## 2017-07-07 NOTE — Consult Note (Addendum)
Advanced Heart Failure Team Consult Note   Primary Physician: Raina Mina., MD PCP-Cardiologist:  Dr Burt Knack Reason for Consultation: heart Failure   HPI:    Alicia [name] is seen today for evaluation of heart failure  at the request of Dr Radford Pax.  Alicia [name] is an 83 year old with history of  PAF, AS, seizure disorder, DMII, CVA,  Hypothyroidism, and syncope in the setting of GI illness.   Admitted to [facility] February, 2019 with syncope in the setting of GI illness and A fib.Hospital course complicated by AKI. Later discharged to rehab. Found unresponsive at rehab with witnessed seizure requiring intubation. Sent back to [facility] ED and loaded on keppra and transferred to [facility] via helicopter. EEG did not show seizure activity. ECHO completed and showed EF 60%. ECG from 04/09/17 in [place] showed AF. Hospital course complicated by dysphagia and UTI. Discharged on April 18, 2017 with a PEG to SNF.    In March of this year she was evaluated by Dr Burt Knack in the valve clinic for possible TAVR. Felt to have moderate=e- to severe AS.  Due to severe deconditioning she was not currently a candidate.  Discharged from SNF about 4 weeks ago. Lives alone but has help from her daughter in law. She has had HHPT/HHRN.  Ambulates with a walker.   She presented to [facility] on 5/28 with increased dyspnea. Admitted with hypotension, hypothermia, and bradycardia. Bld cx were negative. Initially given IV fluids. Cardiology consulted and she was started on dobutamine. Transferred to [facility] last night due to concern for possible cardiogenic shock. Seen by Dr. Harl Bowie and dobutamine continued.  We were asked to see this am by Dr. Radford Pax   Currently on dobutamine 5 mcg. Todays CO-OX is 77%.  She has been diuresing with IV lasix. SBP 110-120 range. Feels good. Denies CP or SOB. No fevers or chills. UA negative . WBC 7k  03/2017 Echo EF 66% at [facility]  Review of Systems: [y] =  yes, _0  = no   General: Weight gain _1 ; Weight loss _2 ; Anorexia _3 ; Fatigue [Y]; Fever _4 ; Chills _5 ; Weakness [ Y]  Cardiac: Chest pain/pressure _6 ; Resting SOB _7 ; Exertional SOB [ Y]; Orthopnea _8 ; Pedal Edema _9 ; Palpitations _10 ; Syncope Y_11 ; Presyncope _12 ; Paroxysmal nocturnal dyspnea_13   Pulmonary: Cough _14 ; Wheezing_15 ; Hemoptysis_16 ; Sputum _17 ; Snoring _18   GI: Vomiting_19 ; Dysphagia_20 ; Melena_21 ; Hematochezia _22 ; Heartburn_23 ; Abdominal pain _24 ; Constipation _25 ; Diarrhea _26 ; BRBPR _27   GU: Hematuria_28 ; Dysuria _29 ; Nocturia_30   Vascular: Pain in legs with walking _31 ; Pain in feet with lying flat _32 ; Non-healing sores _33 ; Stroke _34 ; TIA _35 ; Slurred speech _36 ;  Neuro: Headaches_37 ; Vertigo_38 ; Seizures_39 ; Paresthesias_40 ;Blurred vision _41 ; Diplopia _42 ; Vision changes _43   Ortho/Skin: Arthritis _44 ; Joint pain _45 ; Muscle pain _46 ; Joint swelling _47 ; Back Pain _48 ; Rash _49   Psych: Depression_50 ; Anxiety_51   Heme: Bleeding problems _52 ; Clotting disorders _53 ; Anemia _54   Endocrine: Diabetes [Y ]; Thyroid dysfunction[Y ]  Home Medications Prior to Admission medications   Medication Sig Start Date End Date Taking? Authorizing Provider  allopurinol (ZYLOPRIM) 100 MG tablet Take 100 mg by mouth daily.    [provider]  ALPRAZolam (NIRAVAM) 0.25 MG dissolvable tablet Take 0.25 mg by mouth 2 (two) times daily as needed.  04/18/17   [provider]  apixaban (ELIQUIS) 5 MG TABS tablet Take 5 mg by mouth 2 (two) times daily.    [provider]  aspirin 81 MG chewable tablet Chew 81 mg by mouth daily.    [provider]  atorvastatin (LIPITOR) 40 MG tablet Take 40 mg by mouth daily.    [provider]  bethanechol (URECHOLINE) 25 MG tablet Take 25 mg by mouth 3 (three) times daily.    [provider]  felodipine (PLENDIL) 5 MG 24 hr tablet Take 5 mg by mouth daily.    [provider]  furosemide  (LASIX) 40 MG tablet Take 1 tablet (40 mg total) by mouth daily. 05/03/17 04/28/18  Sherren Mocha, MD  glipiZIDE (GLUCOTROL) 10 MG tablet Take 10 mg by mouth 2 (two) times daily before a meal.    [provider]  GLUCERNA (GLUCERNA) LIQD Take 237 mLs by mouth 2 (two) times daily between meals.    [provider]  levETIRAcetam (KEPPRA) 750 MG tablet Take 750 mg by mouth 2 (two) times daily.    [provider]  levothyroxine (SYNTHROID, LEVOTHROID) 75 MCG tablet Take 1 tablet by mouth daily. 03/28/17   [provider]  Melatonin 3 MG TABS Take 1 tablet by mouth at bedtime.    [provider]  metFORMIN (GLUCOPHAGE) 500 MG tablet Take 500 mg by mouth 2 (two) times daily with a meal.    [provider]  metoprolol tartrate (LOPRESSOR) 25 MG tablet Take 12.5 mg by mouth 2 (two) times daily.    [provider]  oxybutynin (DITROPAN) 5 MG tablet Take 5 mg by mouth 2 (two) times daily.    [provider]  pioglitazone (ACTOS) 15 MG tablet Take 15 mg by mouth daily.    [provider]  saxagliptin HCl (ONGLYZA) 5 MG TABS tablet Take 5 mg by mouth daily.    [provider]    Past Medical History: Past Medical History:  Diagnosis Date  . Aortic stenosis   . CHF (congestive heart failure) (HCC)    Diastolic CHF, aortic valve disease  . Chronic kidney disease   . Diabetes mellitus without complication ([place])    Type II  DM  . Heart murmur   . Hypertension   . Hypothyroidism   . Seizure ([place])   . Stroke ([place])   . Syncope and collapse    occurred in setting of GI illness, UTI, seizure   . Urinary incontinence     Past Surgical History: No past surgical history on file.  Family History: Family History  Problem Relation Age of Onset  . Heart attack Mother   . Diabetes Sister   . CAD Brother   . Hyperlipidemia Son   . Hypertension Son   . Stroke Sister   . Aneurysm Sister     Social History: Social  History   Socioeconomic History  . Marital status: Married    Spouse name: Not on file  . Number of children: Not on file  . Years of education: Not on file  . Highest education level: Not on file  Occupational History  . Not on file  Social Needs  . Financial resource strain: Not on file  . Food insecurity:    Worry: Not on file    Inability: Not on file  . Transportation needs:  Medical: Not on file    Non-medical: Not on file  Tobacco Use  . Smoking status: Never Smoker  . Smokeless tobacco: Never Used  Substance and Sexual Activity  . Alcohol use: Never    Frequency: Never  . Drug use: Never  . Sexual activity: Not on file  Lifestyle  . Physical activity:    Days per week: Not on file    Minutes per session: Not on file  . Stress: Not on file  Relationships  . Social connections:    Talks on phone: Not on file    Gets together: Not on file    Attends religious service: Not on file    Active member of club or organization: Not on file    Attends meetings of clubs or organizations: Not on file    Relationship status: Not on file  Other Topics Concern  . Not on file  Social History Narrative  . Not on file    Allergies:  No Known Allergies  Objective:    Vital Signs:   Temp:  [97.6 F (36.4 C)-98.2 F (36.8 C)] 98.2 F (36.8 C) (05/31 0700) Pulse Rate:  [68-100] 91 (05/31 0900) Resp:  [12-31] 12 (05/31 0900) BP: (100-138)/(39-92) 111/46 (05/31 0900) SpO2:  [84 %-98 %] 97 % (05/31 0900) FiO2 (%):  [0 %] 0 % (05/30 2023) Weight:  [203 lb 0.7 oz (92.1 kg)-204 lb 2.3 oz (92.6 kg)] 203 lb 0.7 oz (92.1 kg) (05/31 0500)    Weight change: Filed Weights   07/06/17 2016 07/07/17 0500  Weight: 204 lb 2.3 oz (92.6 kg) 203 lb 0.7 oz (92.1 kg)    Intake/Output:   Intake/Output Summary (Last 24 hours) at 07/07/2017 1147 Last data filed at 07/07/2017 1100 Gross per 24 hour  Intake 572.22 ml  Output 2775 ml  Net -2202.78 ml      Physical Exam   CVP  10-11 personally checked. General:  Elderly. No resp difficulty. In bed  HEENT: normal anicteric Neck: supple. JVP 9-10. Carotids 2+ bilat; + bruits. No lymphadenopathy or thyromegaly appreciated. Cor: PMI nondisplaced. Irregular rate & rhythm. No rubs, gallops. 2/6 S2 decreased but audible  Lungs: clear Abdomen: soft, nontender, nondistended. No hepatosplenomegaly. No bruits or masses. Good bowel sounds.dbext Neuro: alert & oriented x 3, cranial nerves grossly intact. moves all 4 extremities w/o difficulty. Affect pleasant    Telemetry  NSR 90s personally reviewed.   EKG    A fib 76 bpm on admitting EKG   Labs   Basic Metabolic Panel: Recent Labs  Lab 07/06/17 2110 07/07/17 0810  NA 141 143  K 4.0 3.9  CL 98* 100*  CO2 32 34*  GLUCOSE 212* 169*  BUN 39* 31*  CREATININE 1.07* 1.07*  CALCIUM 9.7 9.7  MG 1.3*  --     Liver Function Tests: Recent Labs  Lab 07/06/17 2110  AST 47*  ALT 48  ALKPHOS 61  BILITOT 0.5  PROT 6.8  ALBUMIN 2.8*   No results for input(s): LIPASE, AMYLASE in the last 168 hours. No results for input(s): AMMONIA in the last 168 hours.  CBC: Recent Labs  Lab 07/06/17 2110 07/07/17 0810  WBC 8.1 7.0  NEUTROABS 6.6  --   HGB 10.1* 9.7*  HCT 33.3* 31.5*  MCV 84.1 85.6  PLT 148* 137*    Cardiac Enzymes: Recent Labs  Lab 07/06/17 2110 07/07/17 0459 07/07/17 0810  TROPONINI <0.03 <0.03 0.03*    BNP: BNP (last 3 results) Recent  Labs    07/06/17 2108/04/16  BNP 328.5*    ProBNP (last 3 results) Recent Labs    05/03/17 1150  PROBNP 1,012*     CBG: Recent Labs  Lab 07/06/17 2238 07/07/17 0637 07/07/17 1114  GLUCAP 212* 128* 187*    Coagulation Studies: Recent Labs    07/06/17 04-16-2108  LABPROT 16.0*  INR 1.29     Imaging   Dg Chest Port 1 View  Result Date: 07/06/2017 CLINICAL DATA:  Acute onset of shortness of breath. PICC placement. EXAM: PORTABLE CHEST 1 VIEW COMPARISON:  Chest radiograph performed 07/04/2017  FINDINGS: There is elevation of the right hemidiaphragm. Bibasilar airspace opacities may reflect atelectasis or pneumonia. Small bilateral pleural effusions are suspected. There is no evidence of pneumothorax. The cardiomediastinal silhouette is borderline enlarged. Cervical spinal fusion hardware is noted. No acute osseous abnormalities are seen. IMPRESSION: 1. Elevation of the right hemidiaphragm. Bibasilar airspace opacities may reflect atelectasis or pneumonia. Small bilateral pleural effusions suspected. 2. Borderline cardiomegaly. Electronically Signed   By: Garald Balding M.D.   On: 07/06/2017 22:40   [place] Ekg Site Rite  Result Date: 07/06/2017 If Site Rite image not attached, placement could not be confirmed due to current cardiac rhythm.    Medications:     Current Medications: . aspirin  81 mg Oral Daily  . atorvastatin  40 mg Oral Daily  . Chlorhexidine Gluconate Cloth  6 each Topical Daily  . furosemide  20 mg Intravenous Q12H  . insulin aspart  0-5 Units Subcutaneous QHS  . insulin aspart  0-9 Units Subcutaneous TID WC  . levETIRAcetam  750 mg Oral BID  . levothyroxine  75 mcg Oral QAC breakfast  . oxybutynin  5 mg Oral Daily  . sodium chloride flush  10-40 mL Intracatheter Q12H    Infusions: . DOBUTamine 5 mcg/kg/min (07/06/17 16-Apr-2305)  . heparin 800 Units/hr (07/07/17 0703)       Patient Profile   ERLINE SIDDOWAY is a 83 y.o. female with a hx of  PAF, aortic stenosis, who is transferred from [facility] for hypotension and SOB.  Assessment/Plan   1. Shock [facility] presentation [facility] -->hypothermia, hypotension, and hypoxia - Afebrile -Respiratory Panel - negative  -Blood/urine culture pending    2. Aortic Stenosis In March she was evaluated by Dr Burt Knack. Plan was to hold off on valve surgery. Plan was to repeat ECHO in 3 months.  - Repeat ECHO  -Continue dobutamine for now. CO-OX 77%  3. A/C Diastolic Heart Failure ECHO 04/16/17 EF 65%.  Repeat ECHO pending.  Volume status mildly elevated. Continue IV lasix.  Renal function stable. -   4. DMII On sliding scale.    5. PAF In NSR today but she has been in and out A fib.  Rate controlled. Off nodal blockers due to bradycardia noted at [facility].   6. H/O Seizure On keppra    Medication concerns reviewed with patient and pharmacy team. Barriers identified: none  Length of Stay: 1  Amy Clegg, NP  07/07/2017, 11:47 AM  Advanced Heart Failure Team Pager [phone] (M-F; 7a - 4p)  Please contact [facility] for night-coverage after hours (4p -7a ) and weekends on [email or web address]  Patient seen and examined with the above-signed Advanced Practice Provider and/or Housestaff. I personally reviewed laboratory data, imaging studies and relevant notes. I independently examined the patient and formulated the important aspects of the plan. I have edited the note to reflect any of my changes  or salient points. I have personally discussed the plan with the patient and/or family.  83 y/o woman with multiple medical problems as above. Advanced HF consultation requested by Dr. Radford Pax.  She has h/o moderate to severe AS and PAF.Saw Dr. Burt Knack earlier this year and felt to have moderate to severe AS.She was too frail to consider TAVR at that point due to prolonged illness. Remains debilitated but somewhat improved.   Admitted to [facility] with reported hypotension and hypothermia. Concern for cardiogenic shock. Started on dobutamine 5 and PICC placed. Co-ox 62%-> 76%. On dobutamine. Echo performed and reviewed personally. EF 60% with moderate AS with mean gradient 35mHG. Moderate Alicia in setting of heavy MAC. Remains in rate-controlled AF ([facility]. Temperature and BP stable.   Exam.  Frail elderly woman in NAD Cor IRR 2/6 AS s2 moderately diminished Lungs CTA Ab obese Soft NT Ext warm trace edema. RUE PICC  She has normal EF and moderate  AS/Alicia on echo. I am unclear on her reason for decompensation but based on available data I doubt it is related to her AS. No evidence of sespis. Possible related to b-blockers and bradycardia. B-blockers have been stopped. Will keep off for now and follow on tele. Co-ox looks good on dobutamine 5. Will wean off. Can transfer to floor. Have PT to see. Restart apixaban.   No current issues for the AHF team. Will have COctaviaresume care. Check Co-ox in am off dobutamine.   DGlori Bickers MD  7:38 PM

## 2017-07-07 NOTE — Progress Notes (Signed)
Chart review shows the foley catheter was placed in the ED at [facility].  Pt was then admitted to the ICU there on 5/29.    Lonia Blood, RN

## 2017-07-08 ENCOUNTER — Other Ambulatory Visit: Payer: Self-pay

## 2017-07-08 DIAGNOSIS — I5033 Acute on chronic diastolic (congestive) heart failure: Secondary | ICD-10-CM

## 2017-07-08 LAB — BASIC METABOLIC PANEL
ANION GAP: 10 (ref 5–15)
BUN: 24 mg/dL — ABNORMAL HIGH (ref 6–20)
CHLORIDE: 96 mmol/L — AB (ref 101–111)
CO2: 33 mmol/L — AB (ref 22–32)
Calcium: 9.6 mg/dL (ref 8.9–10.3)
Creatinine, Ser: 0.98 mg/dL (ref 0.44–1.00)
GFR calc non Af Amer: 53 mL/min — ABNORMAL LOW (ref >60)
Glucose, Bld: 177 mg/dL — ABNORMAL HIGH (ref 65–99)
Potassium: 4.1 mmol/L (ref 3.5–5.1)
Sodium: 139 mmol/L (ref 135–145)

## 2017-07-08 LAB — GLUCOSE, CAPILLARY
Glucose-Capillary: 119 mg/dL — ABNORMAL HIGH (ref 65–99)
Glucose-Capillary: 171 mg/dL — ABNORMAL HIGH (ref 65–99)
Glucose-Capillary: 191 mg/dL — ABNORMAL HIGH (ref 65–99)
Glucose-Capillary: 155 mg/dL — ABNORMAL HIGH (ref 65–99)

## 2017-07-08 LAB — CBC
HEMATOCRIT: 30.8 % — AB (ref 36.0–46.0)
HEMOGLOBIN: 9.2 g/dL — AB (ref 12.0–15.0)
MCH: 25.3 pg — AB (ref 26.0–34.0)
MCHC: 29.9 g/dL — AB (ref 30.0–36.0)
MCV: 84.8 fL (ref 78.0–100.0)
Platelets: 126 10*3/uL — ABNORMAL LOW (ref 150–400)
RBC: 3.63 MIL/uL — AB (ref 3.87–5.11)
RDW: 16.1 % — ABNORMAL HIGH (ref 11.5–15.5)
WBC: 6.4 10*3/uL (ref 4.0–10.5)

## 2017-07-08 LAB — URINE CULTURE: Culture: NO GROWTH

## 2017-07-08 NOTE — Plan of Care (Signed)
  Problem: Education: Goal: Knowledge of General Education information will improve Outcome: Progressing   Problem: Activity: Goal: Risk for activity intolerance will decrease Outcome: Progressing   Problem: Pain Managment: Goal: General experience of comfort will improve Outcome: Progressing   Problem: Coping: Goal: Level of anxiety will decrease Outcome: Completed/Met

## 2017-07-08 NOTE — Evaluation (Addendum)
Physical Therapy Evaluation Patient Details Name: Alicia [name] MRN: [id number] DOB: 13-Apr-1934 Today's Date: 07/08/2017   History of Present Illness  Alicia [name] is an 83 year old with history of  PAF, AS, seizure disorder, DMII, CVA,  Hypothyroidism, and syncope in the setting of GI illness. [facility] with concern for cardiogenic shock 5/29.     Clinical Impression  Pt admitted with above diagnosis. Pt currently with functional limitations due to the deficits listed below (see PT Problem List). PTA, pt living at home with daughter in law care taking, receiving HH services. Pt with limited household ambulation with RW. Has not community ambulated since [name], where she has had a number of hospitalizations and has continued to decondition. Upon eval pt presents with weakness and limited activity tolerance. Pt requires physical assistance to stand and provide stability with short distance ambulation. Min A level at this visit, ambulated 10 feet with HRmax 135, BP WNL, SpO2 WNL on 4L. Pt is a falls risk at this time due to weakness and decondtioning. Providing daughter is comfortable with physical assistance, rec HHPT.  Pt will benefit from skilled PT to increase their independence and safety with mobility to allow discharge to the venue listed below.       Follow Up Recommendations Home health PT;Supervision/Assistance - 24 hour((Assuming DIL is comfortable with level of assistance)) if not, then SNF    Equipment Recommendations  None recommended by PT    Recommendations for Other Services OT consult     Precautions / Restrictions Precautions Precautions: Fall Restrictions Weight Bearing Restrictions: No      Mobility  Bed Mobility Overal bed mobility: Modified Independent                Transfers Overall transfer level: Needs assistance Equipment used: Rolling walker (2 wheeled) Transfers: Sit to/from Stand Sit to Stand: Min assist          General transfer comment: Min A to power up, patient unable to do so on her own at this time due to weakness.   Ambulation/Gait Ambulation/Gait assistance: Min assist Ambulation Distance (Feet): 10 Feet Assistive device: Rolling walker (2 wheeled) Gait Pattern/deviations: Step-to pattern Gait velocity: decreased   General Gait Details: Patient able to ambulate short distance before requesting a seat due to weakness and fatigue. HRmax 130  Stairs            Wheelchair Mobility    Modified Rankin (Stroke Patients Only)       Balance Overall balance assessment: Needs assistance   Sitting balance-Leahy Scale: Fair       Standing balance-Leahy Scale: Poor Standing balance comment: reliant on BUE support and external support for stability standing                             Pertinent Vitals/Pain Pain Assessment: No/denies pain    Home Living Family/patient expects to be discharged to:: Private residence Living Arrangements: Alone   Type of Home: House Home Access: Ramped entrance     Home Layout: One level Home Equipment: Environmental consultantWalker - 2 wheels;Bedside commode;Grab bars - tub/shower      Prior Function Level of Independence: Needs assistance   Gait / Transfers Assistance Needed: community ambulator with RW, drives,   ADL's / Homemaking Assistance Needed: daugther has been staying with patient over last couple weeks helping with ADLs, transportation etc.         Hand Dominance  Extremity/Trunk Assessment   Upper Extremity Assessment Upper Extremity Assessment: Defer to OT evaluation;Generalized weakness    Lower Extremity Assessment Lower Extremity Assessment: Generalized weakness       Communication   Communication: No difficulties  Cognition Arousal/Alertness: Awake/alert Behavior During Therapy: WFL for tasks assessed/performed                                          General Comments      Exercises  General Exercises - Lower Extremity Ankle Circles/Pumps: 20 reps Quad Sets: 10 reps Straight Leg Raises: 10 reps   Assessment/Plan    PT Assessment Patient needs continued PT services  PT Problem List Decreased strength;Decreased activity tolerance;Decreased balance;Cardiopulmonary status limiting activity;Obesity       PT Treatment Interventions DME instruction;Gait training;Stair training;Functional mobility training;Therapeutic activities;Therapeutic exercise;Balance training    PT Goals (Current goals can be found in the Care Plan section)  Acute Rehab PT Goals Patient Stated Goal: go home PT Goal Formulation: With patient Time For Goal Achievement: 07/22/17 Potential to Achieve Goals: Good    Frequency Min 3X/week   Barriers to discharge        Co-evaluation               AM-PAC PT "6 Clicks" Daily Activity  Outcome Measure Difficulty turning over in bed (including adjusting bedclothes, sheets and blankets)?: A Little Difficulty moving from lying on back to sitting on the side of the bed? : A Little Difficulty sitting down on and standing up from a chair with arms (e.g., wheelchair, bedside commode, etc,.)?: A Little Help needed moving to and from a bed to chair (including a wheelchair)?: A Lot Help needed walking in hospital room?: A Lot Help needed climbing 3-5 steps with a railing? : Total 6 Click Score: 14    End of Session Equipment Utilized During Treatment: Gait belt;Oxygen(4L) Activity Tolerance: Patient limited by fatigue Patient left: in chair;with call bell/phone within reach Nurse Communication: Mobility status PT Visit Diagnosis: Unsteadiness on feet (R26.81);Muscle weakness (generalized) (M62.81)    Time: 1610-9604 PT Time Calculation (min) (ACUTE ONLY): 45 min   Charges:   PT Evaluation $PT Eval Low Complexity: 1 Low PT Treatments $Gait Training: 8-22 mins $Therapeutic Activity: 8-22 mins   PT G Codes:        Etta Grandchild, PT,  DPT Acute Rehab Services Pager: [phone]    Etta Grandchild 07/08/2017, 4:21 PM

## 2017-07-08 NOTE — Progress Notes (Signed)
Patient ID: Alicia [name], female   DOB: 10-19-1934, 83 y.o.   MRN: [id number]     Advanced Heart Failure Rounding Note  PCP-Cardiologist: No primary care provider on file.   Subjective:    Patient feels good this morning.  She is off dobutamine gtt.  BP/HR stable, remains in NSR.  CVP 8, on po Lasix.  Was out of bed to chair yesterday.  No BMET today.   Echo: EF 60%, moderate AS mean gradient 22 mmHg, moderate mitral stenosis.    Objective:   Weight Range: 202 lb 6.1 oz (91.8 kg) Body mass index is 34.74 kg/m.   Vital Signs:   Temp:  [97.8 F (36.6 C)-98.4 F (36.9 C)] 98.4 F (36.9 C) (06/01 0815) Pulse Rate:  [63-94] 63 (06/01 0700) Resp:  [12-23] 18 (06/01 0700) BP: (109-133)/(41-84) 124/62 (06/01 0700) SpO2:  [94 %-100 %] 99 % (06/01 0700) Weight:  [202 lb 6.1 oz (91.8 kg)] 202 lb 6.1 oz (91.8 kg) (06/01 0500)    Weight change: Filed Weights   07/06/17 2016 07/07/17 0500 07/08/17 0500  Weight: 204 lb 2.3 oz (92.6 kg) 203 lb 0.7 oz (92.1 kg) 202 lb 6.1 oz (91.8 kg)    Intake/Output:   Intake/Output Summary (Last 24 hours) at 07/08/2017 0825 Last data filed at 07/08/2017 0600 Gross per 24 hour  Intake 704.41 ml  Output 2100 ml  Net -1395.59 ml      Physical Exam    General:  Well appearing. No resp difficulty HEENT: Normal Neck: Supple. JVP not elevated. No lymphadenopathy or thyromegaly appreciated. Cor: PMI nondisplaced. Regular rate & rhythm. 3/6 SEM RUSB, can hear S2 clearly.  Lungs: Clear Abdomen: Soft, nontender, nondistended. No hepatosplenomegaly. No bruits or masses. Good bowel sounds. Extremities: No cyanosis, clubbing, rash, edema Neuro: Alert & orientedx3, cranial nerves grossly intact. moves all 4 extremities w/o difficulty. Affect pleasant   Telemetry   NSR in 80s (personally reviewed)  Labs    CBC Recent Labs    07/06/17 2110 07/07/17 0810 07/08/17 0437  WBC 8.1 7.0 6.4  NEUTROABS 6.6  --   --   HGB 10.1* 9.7* 9.2*  HCT 33.3*  31.5* 30.8*  MCV 84.1 85.6 84.8  PLT 148* 137* 126*   Basic Metabolic Panel Recent Labs    86/57/84 2110 07/07/17 0810  NA 141 143  K 4.0 3.9  CL 98* 100*  CO2 32 34*  GLUCOSE 212* 169*  BUN 39* 31*  CREATININE 1.07* 1.07*  CALCIUM 9.7 9.7  MG 1.3*  --    Liver Function Tests Recent Labs    07/06/17 2110  AST 47*  ALT 48  ALKPHOS 61  BILITOT 0.5  PROT 6.8  ALBUMIN 2.8*   No results for input(s): LIPASE, AMYLASE in the last 72 hours. Cardiac Enzymes Recent Labs    07/06/17 2110 07/07/17 0459 07/07/17 0810  TROPONINI <0.03 <0.03 0.03*    BNP: BNP (last 3 results) Recent Labs    07/06/17 2110  BNP 328.5*    ProBNP (last 3 results) Recent Labs    05/03/17 1150  PROBNP 1,012*     D-Dimer No results for input(s): DDIMER in the last 72 hours. Hemoglobin A1C Recent Labs    07/06/17 2110  HGBA1C 7.4*   Fasting Lipid Panel Recent Labs    07/07/17 0459  CHOL 87  HDL 49  LDLCALC 29  TRIG 46  CHOLHDL 1.8   Thyroid Function Tests Recent Labs    07/06/17 2315  TSH 1.629    Other results:   Imaging     No results found.   Medications:     Scheduled Medications: . apixaban  5 mg Oral BID  . atorvastatin  40 mg Oral Daily  . Chlorhexidine Gluconate Cloth  6 each Topical Daily  . furosemide  40 mg Oral Daily  . insulin aspart  0-5 Units Subcutaneous QHS  . insulin aspart  0-9 Units Subcutaneous TID WC  . levETIRAcetam  750 mg Oral BID  . levothyroxine  75 mcg Oral QAC breakfast  . oxybutynin  5 mg Oral Daily  . sodium chloride flush  10-40 mL Intracatheter Q12H     Infusions:   PRN Medications:  acetaminophen, ALPRAZolam, nitroGLYCERIN, ondansetron (ZOFRAN) IV, sodium chloride flush   Assessment/Plan   1. Shock: Initial presentation with hypotension, hypothermia, hypoxemia.  No evidence for infection/sepsis. WBCs normal and afebrile. ?Related to bradycardia, now off beta blocker.  BP excellent off dobutamine.  2.  Valvular heart disease: Moderate aortic stenosis and moderate mitral stenosis.  AS not significant enough for TAVR at this point.  Continue to follow.  3. Acute/chronic diastolic CHF: She has diuresed well, CVP 8 this morning.  She is back on po Lasix.  - Continue Lasix 40 mg daily.  4. Atrial fibrillation: Paroxysmal.  She is in NSR this morning.  - Beta blocker stopped with bradycardia.  - Continue apixaban (can stop ASA).  5. H/o seizures: On Keppra.   Needs PT.  Can go to telemetry.  Possible home tomorrow if she is mobile enough.   Length of Stay: 2  Alicia Anconaalton Shanayah Kaffenberger, MD  07/08/2017, 8:25 AM  Advanced Heart Failure Team Pager [phone] (M-F; 7a - 4p)  Please contact [facility] for night-coverage after hours (4p -7a ) and weekends on [email or web address]

## 2017-07-09 ENCOUNTER — Encounter (HOSPITAL_COMMUNITY): Payer: Self-pay

## 2017-07-09 DIAGNOSIS — I5032 Chronic diastolic (congestive) heart failure: Secondary | ICD-10-CM

## 2017-07-09 LAB — GLUCOSE, CAPILLARY
Glucose-Capillary: 145 mg/dL — ABNORMAL HIGH (ref 65–99)
Glucose-Capillary: 183 mg/dL — ABNORMAL HIGH (ref 65–99)
Glucose-Capillary: 295 mg/dL — ABNORMAL HIGH (ref 65–99)
Glucose-Capillary: 225 mg/dL — ABNORMAL HIGH (ref 65–99)

## 2017-07-09 LAB — BASIC METABOLIC PANEL
ANION GAP: 11 (ref 5–15)
BUN: 22 mg/dL — ABNORMAL HIGH (ref 6–20)
CALCIUM: 9.6 mg/dL (ref 8.9–10.3)
CHLORIDE: 95 mmol/L — AB (ref 101–111)
CO2: 34 mmol/L — ABNORMAL HIGH (ref 22–32)
Creatinine, Ser: 1.22 mg/dL — ABNORMAL HIGH (ref 0.44–1.00)
GFR calc non Af Amer: 41 mL/min — ABNORMAL LOW (ref >60)
GFR, EST AFRICAN AMERICAN: 47 mL/min — AB (ref >60)
Glucose, Bld: 163 mg/dL — ABNORMAL HIGH (ref 65–99)
POTASSIUM: 3.9 mmol/L (ref 3.5–5.1)
Sodium: 140 mmol/L (ref 135–145)

## 2017-07-09 LAB — CBC
HEMATOCRIT: 31 % — AB (ref 36.0–46.0)
HEMOGLOBIN: 9.5 g/dL — AB (ref 12.0–15.0)
MCH: 25.7 pg — ABNORMAL LOW (ref 26.0–34.0)
MCHC: 30.6 g/dL (ref 30.0–36.0)
MCV: 83.8 fL (ref 78.0–100.0)
Platelets: 134 10*3/uL — ABNORMAL LOW (ref 150–400)
RBC: 3.7 MIL/uL — AB (ref 3.87–5.11)
RDW: 16.1 % — AB (ref 11.5–15.5)
WBC: 6.8 10*3/uL (ref 4.0–10.5)

## 2017-07-09 NOTE — Progress Notes (Signed)
Patient ID: Alicia FairyBarbara S [name], female   DOB: 1934-11-24, 83 y.o.   MRN: [id number]     Advanced Heart Failure Rounding Note  PCP-Cardiologist: No primary care provider on file.   Subjective:    No complaints other than generalized weakness. HR and BP ok.  Does not think she can manage at home.  PICC line with surrounding hematoma.   Echo: EF 60%, moderate AS mean gradient 22 mmHg, moderate mitral stenosis.    Objective:   Weight Range: 203 lb 7.8 oz (92.3 kg) Body mass index is 34.93 kg/m.   Vital Signs:   Temp:  [97.8 F (36.6 C)-99.1 F (37.3 C)] 99.1 F (37.3 C) (06/02 0521) Pulse Rate:  [63-108] 74 (06/02 0521) Resp:  [17-26] 19 (06/01 2200) BP: (123-150)/(52-82) 128/52 (06/02 0521) SpO2:  [93 %-100 %] 97 % (06/02 0521) Weight:  [203 lb 7.8 oz (92.3 kg)] 203 lb 7.8 oz (92.3 kg) (06/02 0521) Last BM Date: 07/07/17  Weight change: Filed Weights   07/07/17 0500 07/08/17 0500 07/09/17 0521  Weight: 203 lb 0.7 oz (92.1 kg) 202 lb 6.1 oz (91.8 kg) 203 lb 7.8 oz (92.3 kg)    Intake/Output:   Intake/Output Summary (Last 24 hours) at 07/09/2017 1313 Last data filed at 07/09/2017 1236 Gross per 24 hour  Intake 240 ml  Output 2550 ml  Net -2310 ml      Physical Exam    General: NAD Neck: No JVD, no thyromegaly or thyroid nodule.  Lungs: Clear to auscultation bilaterally with normal respiratory effort. CV: Nondisplaced PMI.  Heart regular S1/S2, no S3/S4, 3/6 SEM RUSB, can hear S2 well.  No peripheral edema.   Abdomen: Soft, nontender, no hepatosplenomegaly, no distention.  Skin: Intact without lesions or rashes.  Neurologic: Alert and oriented x 3.  Psych: Normal affect. Extremities: No clubbing or cyanosis.  HEENT: Normal.    Telemetry   NSR in 80s-90s (personally reviewed)  Labs    CBC Recent Labs    07/06/17 2110  07/08/17 0437 07/09/17 0436  WBC 8.1   < > 6.4 6.8  NEUTROABS 6.6  --   --   --   HGB 10.1*   < > 9.2* 9.5*  HCT 33.3*   < > 30.8* 31.0*    MCV 84.1   < > 84.8 83.8  PLT 148*   < > 126* 134*   < > = values in this interval not displayed.   Basic Metabolic Panel Recent Labs    53/66/4405/30/19 2110  07/08/17 1122 07/09/17 0436  NA 141   < > 139 140  K 4.0   < > 4.1 3.9  CL 98*   < > 96* 95*  CO2 32   < > 33* 34*  GLUCOSE 212*   < > 177* 163*  BUN 39*   < > 24* 22*  CREATININE 1.07*   < > 0.98 1.22*  CALCIUM 9.7   < > 9.6 9.6  MG 1.3*  --   --   --    < > = values in this interval not displayed.   Liver Function Tests Recent Labs    07/06/17 2110  AST 47*  ALT 48  ALKPHOS 61  BILITOT 0.5  PROT 6.8  ALBUMIN 2.8*   No results for input(s): LIPASE, AMYLASE in the last 72 hours. Cardiac Enzymes Recent Labs    07/06/17 2110 07/07/17 0459 07/07/17 0810  TROPONINI <0.03 <0.03 0.03*    BNP: BNP (last 3 results)  Recent Labs    07/06/17 2110  BNP 328.5*    ProBNP (last 3 results) Recent Labs    05/03/17 1150  PROBNP 1,012*     D-Dimer No results for input(s): DDIMER in the last 72 hours. Hemoglobin A1C Recent Labs    07/06/17 2110  HGBA1C 7.4*   Fasting Lipid Panel Recent Labs    07/07/17 0459  CHOL 87  HDL 49  LDLCALC 29  TRIG 46  CHOLHDL 1.8   Thyroid Function Tests Recent Labs    07/06/17 2315  TSH 1.629    Other results:   Imaging    No results found.   Medications:     Scheduled Medications: . apixaban  5 mg Oral BID  . atorvastatin  40 mg Oral Daily  . Chlorhexidine Gluconate Cloth  6 each Topical Daily  . furosemide  40 mg Oral Daily  . insulin aspart  0-5 Units Subcutaneous QHS  . insulin aspart  0-9 Units Subcutaneous TID WC  . levETIRAcetam  750 mg Oral BID  . levothyroxine  75 mcg Oral QAC breakfast  . oxybutynin  5 mg Oral Daily  . sodium chloride flush  10-40 mL Intracatheter Q12H    Infusions:   PRN Medications: acetaminophen, ALPRAZolam, nitroGLYCERIN, ondansetron (ZOFRAN) IV, sodium chloride flush   Assessment/Plan   1. Shock: Initial  presentation with hypotension, hypothermia, hypoxemia.  No evidence for infection/sepsis. WBCs normal and afebrile. ?Related to bradycardia, now off beta blocker.  BP excellent off dobutamine. Blood cultures negative.  2. Valvular heart disease: Moderate aortic stenosis and moderate mitral stenosis.  AS not significant enough for TAVR at this point.  Continue to follow.  3. Acute/chronic diastolic CHF: She has diuresed well, CVP 7 this morning.  She is back on po Lasix.  - Continue Lasix 40 mg daily.  - Can remove PICC.  4. Atrial fibrillation: Paroxysmal.  She is in NSR this morning.  - Beta blocker stopped with bradycardia.  - Continue apixaban (can stop ASA).  5. H/o seizures: On Keppra.   She does not think she can manage at home. I think a rehab stay would be beneficial.  Will consult social work.   Length of Stay: 3  Alicia Ancona, MD  07/09/2017, 1:13 PM  Advanced Heart Failure Team Pager [phone] (M-F; 7a - 4p)  Please contact [facility] for night-coverage after hours (4p -7a ) and weekends on [email or web address]

## 2017-07-10 DIAGNOSIS — I480 Paroxysmal atrial fibrillation: Secondary | ICD-10-CM

## 2017-07-10 LAB — BASIC METABOLIC PANEL
Anion gap: 9 (ref 5–15)
BUN: 23 mg/dL — ABNORMAL HIGH (ref 6–20)
CALCIUM: 9.2 mg/dL (ref 8.9–10.3)
CO2: 34 mmol/L — ABNORMAL HIGH (ref 22–32)
CREATININE: 1.13 mg/dL — AB (ref 0.44–1.00)
Chloride: 96 mmol/L — ABNORMAL LOW (ref 101–111)
GFR calc non Af Amer: 45 mL/min — ABNORMAL LOW (ref >60)
GFR, EST AFRICAN AMERICAN: 52 mL/min — AB (ref >60)
Glucose, Bld: 156 mg/dL — ABNORMAL HIGH (ref 65–99)
Potassium: 3.8 mmol/L (ref 3.5–5.1)
Sodium: 139 mmol/L (ref 135–145)

## 2017-07-10 LAB — GLUCOSE, CAPILLARY
Glucose-Capillary: 218 mg/dL — ABNORMAL HIGH (ref 65–99)
Glucose-Capillary: 131 mg/dL — ABNORMAL HIGH (ref 65–99)
Glucose-Capillary: 213 mg/dL — ABNORMAL HIGH (ref 65–99)
Glucose-Capillary: 190 mg/dL — ABNORMAL HIGH (ref 65–99)
Glucose-Capillary: 199 mg/dL — ABNORMAL HIGH (ref 65–99)

## 2017-07-10 LAB — CBC
HCT: 32.2 % — ABNORMAL LOW (ref 36.0–46.0)
Hemoglobin: 9.7 g/dL — ABNORMAL LOW (ref 12.0–15.0)
MCH: 25.4 pg — ABNORMAL LOW (ref 26.0–34.0)
MCHC: 30.1 g/dL (ref 30.0–36.0)
MCV: 84.3 fL (ref 78.0–100.0)
PLATELETS: 101 10*3/uL — AB (ref 150–400)
RBC: 3.82 MIL/uL — ABNORMAL LOW (ref 3.87–5.11)
RDW: 16.1 % — AB (ref 11.5–15.5)
WBC: 6.4 10*3/uL (ref 4.0–10.5)

## 2017-07-10 LAB — MRSA PCR SCREENING: MRSA by PCR: NEGATIVE

## 2017-07-10 LAB — MAGNESIUM: Magnesium: 1.6 mg/dL — ABNORMAL LOW (ref 1.7–2.4)

## 2017-07-10 NOTE — Evaluation (Signed)
Occupational Therapy Evaluation Patient Details Name: Alicia [name] MRN: [id number] DOB: 11/26/1934 Today's Date: 07/10/2017    History of Present Illness Ms Boutwell is an 83 year old with history of  PAF, AS, seizure disorder, DMII, CVA,  Hypothyroidism, and syncope in the setting of GI illness. Transferred from [facility] with concern for cardiogenic shock 5/29.    Clinical Impression   Pt reports she was independent with ADL until a few weeks ago; recently she has required assist from family especially for IADL. Currently pt min-mod assist for functional mobility and ADL. Pt fatigues quickly with standing activity; DOE 3/4, SpO2 94% on 3L. Recommending SNF for follow up to maximize independence and safety with ADL and functional mobility prior to return home. Pt would benefit from continued skilled OT to address established goals.    Follow Up Recommendations  SNF;Supervision/Assistance - 24 hour    Equipment Recommendations  None recommended by OT    Recommendations for Other Services       Precautions / Restrictions Precautions Precautions: Fall Restrictions Weight Bearing Restrictions: No      Mobility Bed Mobility Overal bed mobility: Modified Independent             General bed mobility comments: Pt OOB in chair upon arrival  Transfers Overall transfer level: Needs assistance Equipment used: Rolling walker (2 wheeled) Transfers: Sit to/from Stand Sit to Stand: Mod assist         General transfer comment: Mod assist to boost up from chair. Good hand placement and technique.    Balance Overall balance assessment: Needs assistance Sitting-balance support: Feet supported;No upper extremity supported Sitting balance-Leahy Scale: Good     Standing balance support: Single extremity supported;During functional activity Standing balance-Leahy Scale: Poor Standing balance comment: reliant on BUE support and external support for stability standing                           ADL either performed or assessed with clinical judgement   ADL Overall ADL's : Needs assistance/impaired Eating/Feeding: Set up;Sitting   Grooming: Min guard;Standing;Oral care Grooming Details (indicate cue type and reason): Tolerated standing at the sink x2 minutes for oral care Upper Body Bathing: Minimal assistance;Sitting   Lower Body Bathing: Moderate assistance;Sit to/from stand   Upper Body Dressing : Minimal assistance;Sitting   Lower Body Dressing: Moderate assistance;Sit to/from stand   Toilet Transfer: Ambulation;BSC;RW;Moderate Dentist Details (indicate cue type and reason): Simulated by sit to satnd from chair with functional mobility in room, mod assist for sit to stand min assist for mobility         Functional mobility during ADLs: Minimal assistance;Rolling walker General ADL Comments: SpO2 94% on 3L after activity; DOE 3/4.      Vision         Perception     Praxis      Pertinent Vitals/Pain Pain Assessment: Faces Faces Pain Scale: Hurts little more Pain Location: L knee with mobility Pain Descriptors / Indicators: Discomfort;Sore Pain Intervention(s): Monitored during session     Hand Dominance     Extremity/Trunk Assessment Upper Extremity Assessment Upper Extremity Assessment: Generalized weakness   Lower Extremity Assessment Lower Extremity Assessment: Defer to PT evaluation   Cervical / Trunk Assessment Cervical / Trunk Assessment: Kyphotic   Communication Communication Communication: No difficulties   Cognition Arousal/Alertness: Awake/alert Behavior During Therapy: WFL for tasks assessed/performed Overall Cognitive Status: Within Functional Limits for tasks assessed  General Comments       Exercises    Shoulder Instructions      Home Living Family/patient expects to be discharged to:: Private residence Living Arrangements:  Alone Available Help at Discharge: Family;Available 24 hours/day(daughter in law has been staying with her 24/7 recently) Type of Home: House Home Access: Ramped entrance     Home Layout: One level     Bathroom Shower/Tub: Producer, television/film/videoWalk-in shower   Bathroom Toilet: Standard     Home Equipment: Environmental consultantWalker - 2 wheels;Bedside commode;Grab bars - tub/shower;Shower seat          Prior Functioning/Environment Level of Independence: Needs assistance  Gait / Transfers Assistance Needed: community ambulator with RW, drives,  ADL's / Homemaking Assistance Needed: daugther has been staying with patient over last couple weeks helping with ADLs, transportation etc.             OT Problem List: Decreased strength;Decreased activity tolerance;Impaired balance (sitting and/or standing);Decreased knowledge of use of DME or AE;Cardiopulmonary status limiting activity;Obesity;Pain;Increased edema      OT Treatment/Interventions: Self-care/ADL training;Therapeutic exercise;Energy conservation;DME and/or AE instruction;Therapeutic activities;Patient/family education;Balance training    OT Goals(Current goals can be found in the care plan section) Acute Rehab OT Goals Patient Stated Goal: get stronger and go home OT Goal Formulation: With patient Time For Goal Achievement: 07/24/17 Potential to Achieve Goals: Good ADL Goals Pt Will Perform Grooming: with supervision;standing(x3 tasks) Pt Will Perform Upper Body Bathing: with set-up;sitting Pt Will Perform Lower Body Bathing: with supervision;sit to/from stand Pt Will Transfer to Toilet: with supervision;ambulating;bedside commode Pt Will Perform Toileting - Clothing Manipulation and hygiene: with supervision;sit to/from stand Additional ADL Goal #1: Pt will independently verbally recall 3 energy conservation strategies and utilize during ADL.  OT Frequency: Min 2X/week   Barriers to D/C:            Co-evaluation              AM-PAC PT "6  Clicks" Daily Activity     Outcome Measure Help from another person eating meals?: None Help from another person taking care of personal grooming?: A Little Help from another person toileting, which includes using toliet, bedpan, or urinal?: A Lot Help from another person bathing (including washing, rinsing, drying)?: A Lot Help from another person to put on and taking off regular upper body clothing?: A Little Help from another person to put on and taking off regular lower body clothing?: A Lot 6 Click Score: 16   End of Session Equipment Utilized During Treatment: Rolling walker;Oxygen  Activity Tolerance: Patient tolerated treatment well Patient left: in chair;with call bell/phone within reach;with chair alarm set  OT Visit Diagnosis: Unsteadiness on feet (R26.81);Muscle weakness (generalized) (M62.81);Pain Pain - Right/Left: Left Pain - part of body: Knee                Time: 1610-96041408-1425 OT Time Calculation (min): 17 min Charges:  OT General Charges $OT Visit: 1 Visit OT Evaluation $OT Eval Moderate Complexity: 1 Mod G-Codes:     Sarahann Horrell A. Brett Albinooffey, M.S., OTR/L Acute Rehab Department: [phone]  Gaye AlkenBailey A Yena Tisby 07/10/2017, 2:45 PM

## 2017-07-10 NOTE — Clinical Social Work Note (Signed)
Clinical Social Work Assessment  Patient Details  Name: Alicia [name] MRN: [id number] Date of Birth: Jan 23, 1935  Date of referral:  07/10/17               Reason for consult:  Facility Placement, Discharge Planning                Permission sought to share information with:  Facility Sport and exercise psychologist, Family Supports Permission granted to share information::  Yes, Verbal Permission Granted  Name::     Alicia [name]  Agency::  SNFs  Relationship::  son  Contact Information:  [phone]  Housing/Transportation Living arrangements for the past 2 months:  Single Family Home Source of Information:  Patient Patient Interpreter Needed:  None Criminal Activity/Legal Involvement Pertinent to Current Situation/Hospitalization:  No - Comment as needed Significant Relationships:  Adult Children Lives with:  Self Do you feel safe going back to the place where you live?  Yes Need for family participation in patient care:  Yes (Comment)  Care giving concerns:  Patient from home alone. PT recommending home health with 24 hour care. Patient does not have 24 hour care, so plan for SNF.   Social Worker assessment / plan: CSW met with patient at bedside. Patient alert and oriented. CSW introduced self and role and discussed disposition planning. Patient agreeable to SNF, stating that she does not have family who can help her at home 24/7. Patient reported she has been in [facility] and prefers to go there again. CSW sent out initial referrals and also left voicemail for [facility] admissions. CSW awaiting bed offers and will support with discharge planning.   Employment status:  Retired Research officer, political party) PT Recommendations:  Home with [place], [place] / Referral to community resources:  [place]  Patient/Family's Response to care: Patient appreciative of care.  Patient/Family's Understanding of and Emotional Response to  Diagnosis, Current Treatment, and Prognosis: Patient with understanding of condition and agreeable to SNF.  Emotional Assessment Appearance:  Appears stated age Attitude/Demeanor/Rapport:    Affect (typically observed):  Appropriate, Calm Orientation:  Oriented to Self, Oriented to Place, Oriented to  Time, Oriented to Situation Alcohol / Substance use:  Not Applicable Psych involvement (Current and /or in the community):  No (Comment)  Discharge Needs  Concerns to be addressed:  Discharge Planning Concerns, Care Coordination Readmission within the last 30 days:  No Current discharge risk:  Physical Impairment Barriers to Discharge:  Continued Medical Work up   Estanislado Emms, LCSW 07/10/2017, 11:22 AM

## 2017-07-10 NOTE — Progress Notes (Addendum)
Progress Note  Patient Name: Alicia [name] Date of Encounter: 07/10/2017  Primary Cardiologist: No primary care provider on file.   Subjective   83 year old female with a history of paroxysmal atrial fibrillation.  She was admitted on high-dose AV nodal blocking agents and was admitted with cardiogenic shock related to bradycardia.  She was originally admitted at [facility] and was transferred to [facility] for further evaluation and management. He was initially treated with pressors and Lasix.  She diuresed and is feeling quite a bit better.  Net diuresis of 6.4 L.  He also has moderate aortic stenosis with an estimated  Mean aortic valve gradient of 22 mmHg.  Doing well this AM. She is requesting to go to rehab because she feels that she is weak and does not have the needed support at home. Social work has been consulted. Asking for a rehab facility in [place].   Inpatient Medications    Scheduled Meds: . apixaban  5 mg Oral BID  . atorvastatin  40 mg Oral Daily  . Chlorhexidine Gluconate Cloth  6 each Topical Daily  . furosemide  40 mg Oral Daily  . insulin aspart  0-5 Units Subcutaneous QHS  . insulin aspart  0-9 Units Subcutaneous TID WC  . levETIRAcetam  750 mg Oral BID  . levothyroxine  75 mcg Oral QAC breakfast  . oxybutynin  5 mg Oral Daily  . sodium chloride flush  10-40 mL Intracatheter Q12H   Continuous Infusions:  PRN Meds: acetaminophen, ALPRAZolam, nitroGLYCERIN, ondansetron (ZOFRAN) IV, sodium chloride flush   Vital Signs    Vitals:   07/09/17 0521 07/09/17 1333 07/09/17 2121 07/10/17 0610  BP: (!) 128/52 131/84 (!) 147/65 139/67  Pulse: 74 67 88 92  Resp:      Temp: 99.1 F (37.3 C) 98.6 F (37 C) 98.8 F (37.1 C) 98.4 F (36.9 C)  TempSrc: Oral Oral Oral Oral  SpO2: 97% 100% 97% 99%  Weight: 203 lb 7.8 oz (92.3 kg)   204 lb 9.4 oz (92.8 kg)  Height:        Intake/Output Summary (Last 24 hours) at 07/10/2017 1001 Last data filed at  07/10/2017 0349 Gross per 24 hour  Intake 240 ml  Output 1000 ml  Net -760 ml   Filed Weights   07/08/17 0500 07/09/17 0521 07/10/17 0610  Weight: 202 lb 6.1 oz (91.8 kg) 203 lb 7.8 oz (92.3 kg) 204 lb 9.4 oz (92.8 kg)    Telemetry    Episodes of sinus bradycardia and atrial fibrillation with RVR - Personally Reviewed  ECG    No new EKG for review - Personally Reviewed  Physical Exam   General: elderly , frail female,  Pulm: Good air movement with no wheezing or crackles  CV: RR, 2/6 systolic murmur   Abdomen: Active bowel sounds, soft, non-distended, no tenderness to palpation  Extremities: Pulses palpable in all extremities, no LE edema  Skin: Warm and dry  Neuro: Alert and oriented x 3  Labs    Chemistry Recent Labs  Lab 07/06/17 2110  07/08/17 1122 07/09/17 0436 07/10/17 0624  NA 141   < > 139 140 139  K 4.0   < > 4.1 3.9 3.8  CL 98*   < > 96* 95* 96*  CO2 32   < > 33* 34* 34*  GLUCOSE 212*   < > 177* 163* 156*  BUN 39*   < > 24* 22* 23*  CREATININE 1.07*   < >  0.98 1.22* 1.13*  CALCIUM 9.7   < > 9.6 9.6 9.2  PROT 6.8  --   --   --   --   ALBUMIN 2.8*  --   --   --   --   AST 47*  --   --   --   --   ALT 48  --   --   --   --   ALKPHOS 61  --   --   --   --   BILITOT 0.5  --   --   --   --   GFRNONAA 48*   < > 53* 41* 45*  GFRAA 55*   < > >60 47* 52*  ANIONGAP 11   < > 10 11 9    < > = values in this interval not displayed.    Hematology Recent Labs  Lab 07/08/17 0437 07/09/17 0436 07/10/17 0624  WBC 6.4 6.8 6.4  RBC 3.63* 3.70* 3.82*  HGB 9.2* 9.5* 9.7*  HCT 30.8* 31.0* 32.2*  MCV 84.8 83.8 84.3  MCH 25.3* 25.7* 25.4*  MCHC 29.9* 30.6 30.1  RDW 16.1* 16.1* 16.1*  PLT 126* 134* 101*   Cardiac Enzymes Recent Labs  Lab 07/06/17 2110 07/07/17 0459 07/07/17 0810  TROPONINI <0.03 <0.03 0.03*   No results for input(s): TROPIPOC in the last 168 hours.   BNP Recent Labs  Lab 07/06/17 2110  BNP 328.5*    DDimer No results for input(s):  DDIMER in the last 168 hours.   Radiology    No results found.  Cardiac Studies   Echocardiogram 07/07/2017  - Left ventricle: The cavity size was normal. Wall thickness was   increased in a pattern of mild LVH. Systolic function was normal.   The estimated ejection fraction was in the range of 55% to 60%.   The study is not technically sufficient to allow evaluation of LV   diastolic function. - Aortic valve: There was moderate stenosis. There was trivial   regurgitation. Valve area (VTI): 0.92 cm^2. Valve area (Vmax):   0.88 cm^2. Valve area (Vmean): 0.91 cm^2. - Mitral valve: Severe MAC with moderate functional MS. The   findings are consistent with moderate stenosis. Valve area by   continuity equation (using LVOT flow): 1.49 cm^2. - Left atrium: The atrium was moderately dilated. - Atrial septum: No defect or patent foramen ovale was identified.  Patient Profile     83 y.o. female who presented with signs and symptoms of cardiogenic shock secondary to bradycardia and acute heart failure exacerbation. She was initially managed in the ICU with dobutamine and IV diuretics. She has since been weaned off dobutamine and been transitioned to PO lasix.   Assessment & Plan   Acute on Chronic HFpEF - Negative 6.5 L since admission  - Weight at 204 lbs  - Renal function and potassium stable  - Continue PO lasix  - Off nodal blocking agents due to bradycardia  Valvular Heart Disease - Moderate aortic and mitral stenosis on echocardiogram  - Not significant enough for intervention  - Continue to follow  Atrial Fibrillation, Paroxysmal  - Currently in NSR  ( goes in and out of AFib )  - Continue anticoagulation with apixaban  - Off nodal blocking agents due to bradycardia. Could consider amiodarone for antiarrhythmic and rate control   Cardiogenic Shock.  - Now off dobutamine and beta-blocker  - Hemodynamically stable  For questions or updates, please contact [place] Please consult  [email or web address] for contact info under Cardiology/STEMI.     Signed, Ina Homes, MD  07/10/2017, 10:01 AM    Attending Note:   The patient was seen and examined.  Agree with assessment and plan as noted above.  Changes made to the above note as needed.  Patient seen and independently examined with Ina Homes, MD  .   We discussed all aspects of the encounter. I agree with the assessment and plan as stated above.  1.  Paroxysmal atrial fibrillation: The patient has paroxysmal atrial fibrillation.  She is been in and out of A. fib through the day. She is back in atrial fib by my exam this am .   She was on carvedilol 6.25 mg twice a day which caused her bradycardia.  She quite likely has sick sinus syndrome and it may be difficult to control her heart rate without the help of the pacemaker.  At present her heart rate is in the 80s and there is no indication of pacer at this time.  Will see how she does with PT and OT eval She would like to go to [facility] in [place] ( ? [place] )  Will get a case manager consult    I have spent a total of 40 minutes with patient reviewing hospital  notes , telemetry, EKGs, labs and examining patient as well as establishing an assessment and plan that was discussed with the patient. > 50% of time was spent in direct patient care.   Thayer Headings, Brooke Bonito., MD, [facility] 07/10/2017, 10:46 AM [address]  [place] Pager [phone]

## 2017-07-10 NOTE — NC FL2 (Signed)
[place] MEDICAID FL2 LEVEL OF CARE SCREENING TOOL     IDENTIFICATION  Patient Name: Alicia [name] Birthdate: [birth year] Sex: female Admission Date (Current Location): 07/06/2017  [facility] [place]   Facility and Address:  [facility], [address], [place], [place] [address]      Provider Number: [id number]  Attending Physician Name and Address:  Antoine PocheBranch, Jonathan F, MD  Relative Name and Phone Number:  Jules Husbandsony Todorov, son, [phone]    Current Level of Care: Hospital Recommended Level of Care: Skilled Nursing Facility Prior Approval Number:    Date Approved/Denied:   PASRR Number: [id number]  Discharge Plan: SNF    Current Diagnoses: Patient Active Problem List   Diagnosis Date Noted  . Cardiogenic shock (HCC) 07/06/2017    Orientation RESPIRATION BLADDER Height & Weight     Self, Time, Situation, Place  Normal Continent Weight: 204 lb 9.4 oz (92.8 kg) Height:  5\' 4"  (162.6 cm)  BEHAVIORAL SYMPTOMS/MOOD NEUROLOGICAL BOWEL NUTRITION STATUS      Continent Diet(please see DC summary)  AMBULATORY STATUS COMMUNICATION OF NEEDS Skin   Limited Assist Verbally Normal                       Personal Care Assistance Level of Assistance  Bathing, Feeding, Dressing Bathing Assistance: Limited assistance Feeding assistance: Independent Dressing Assistance: Limited assistance     Functional Limitations Info  Sight, Hearing, Speech Sight Info: Adequate Hearing Info: Adequate Speech Info: Adequate    SPECIAL CARE FACTORS FREQUENCY  PT (By licensed PT), OT (By licensed OT)     PT Frequency: 5x/week OT Frequency: 5x/week            Contractures Contractures Info: Not present    Additional Factors Info  Code Status, Allergies, Insulin Sliding Scale Code Status Info: Full Allergies Info: No Known Allergies   Insulin Sliding Scale Info: novolog 3x/day with meals and at bedtime       Current Medications  (07/10/2017):  This is the current hospital active medication list Current Facility-Administered Medications  Medication Dose Route Frequency Provider Last Rate Last Dose  . acetaminophen (TYLENOL) tablet 650 mg  650 mg Oral Q4H PRN Bensimhon, Bevelyn Bucklesaniel R, MD      . ALPRAZolam Prudy Feeler(XANAX) tablet 0.25 mg  0.25 mg Oral BID PRN Bensimhon, Bevelyn Bucklesaniel R, MD   0.25 mg at 07/10/17 0855  . apixaban (ELIQUIS) tablet 5 mg  5 mg Oral BID Laurey MoraleMcLean, Dalton S, MD   5 mg at 07/10/17 0851  . atorvastatin (LIPITOR) tablet 40 mg  40 mg Oral Daily Bensimhon, Bevelyn Bucklesaniel R, MD   40 mg at 07/10/17 0851  . Chlorhexidine Gluconate Cloth 2 % PADS 6 each  6 each Topical Daily Bensimhon, Bevelyn Bucklesaniel R, MD   6 each at 07/09/17 1045  . furosemide (LASIX) tablet 40 mg  40 mg Oral Daily Bensimhon, Bevelyn Bucklesaniel R, MD   40 mg at 07/10/17 0852  . insulin aspart (novoLOG) injection 0-5 Units  0-5 Units Subcutaneous QHS Bensimhon, Bevelyn Bucklesaniel R, MD   5 Units at 07/09/17 2138  . insulin aspart (novoLOG) injection 0-9 Units  0-9 Units Subcutaneous TID WC Bensimhon, Bevelyn Bucklesaniel R, MD   1 Units at 07/10/17 [phone]  . levETIRAcetam (KEPPRA) tablet 750 mg  750 mg Oral BID Bensimhon, Bevelyn Bucklesaniel R, MD   750 mg at 07/10/17 0852  . levothyroxine (SYNTHROID, LEVOTHROID) tablet 75 mcg  75 mcg Oral QAC breakfast Bensimhon, Reuel Boomaniel  R, MD   75 mcg at 07/10/17 0852  . nitroGLYCERIN (NITROSTAT) SL tablet 0.4 mg  0.4 mg Sublingual Q5 Min x 3 PRN Bensimhon, Bevelyn Buckles, MD      . ondansetron [facility]) injection 4 mg  4 mg Intravenous Q6H PRN Bensimhon, Bevelyn Buckles, MD      . oxybutynin (DITROPAN) tablet 5 mg  5 mg Oral Daily Bensimhon, Bevelyn Buckles, MD   5 mg at 07/10/17 0852  . sodium chloride flush (NS) 0.9 % injection 10-40 mL  10-40 mL Intracatheter Q12H Bensimhon, Bevelyn Buckles, MD   20 mL at 07/09/17 4696  . sodium chloride flush (NS) 0.9 % injection 10-40 mL  10-40 mL Intracatheter PRN Bensimhon, Bevelyn Buckles, MD         Discharge Medications: Please see discharge summary for a list of discharge  medications.  Relevant Imaging Results:  Relevant Lab Results:   Additional Information SSN: [id number]  Abigail Butts, LCSW

## 2017-07-10 NOTE — Progress Notes (Signed)
Physical Therapy Treatment Patient Details Name: Alicia FairyBarbara S [name] MRN: [id number] DOB: 01/17/1935 Today's Date: 07/10/2017    History of Present Illness Ms Alicia Argueance is an 83 year old with history of  PAF, AS, seizure disorder, DMII, CVA,  Hypothyroidism, and syncope in the setting of GI illness. [facility] with concern for cardiogenic shock 5/29.     PT Comments    Pt admitted with above diagnosis. Pt currently with functional limitations due to the deficits listed below (see PT Problem List). Pt was able to ambulate with RW in room short distance with min assist.  Will need SNF if she does not have 24 hour care.  Will follow acutely. Pt was on 3L to keep sats >90%.   Pt will benefit from skilled PT to increase their independence and safety with mobility to allow discharge to the venue listed below.     Follow Up Recommendations  Supervision/Assistance - 24 hour;SNF(unless pt has 24 hour assist, then HHPT appropriate)     Equipment Recommendations  None recommended by PT    Recommendations for Other Services OT consult     Precautions / Restrictions Precautions Precautions: Fall Restrictions Weight Bearing Restrictions: No    Mobility  Bed Mobility Overal bed mobility: Modified Independent             General bed mobility comments: Needed incr time  Transfers Overall transfer level: Needs assistance Equipment used: Rolling walker (2 wheeled) Transfers: Sit to/from Stand Sit to Stand: Min assist         General transfer comment: Min A to power up  Ambulation/Gait Ambulation/Gait assistance: Min assist;Min guard Ambulation Distance (Feet): 10 Feet Assistive device: Rolling walker (2 wheeled) Gait Pattern/deviations: Step-to pattern;Trunk flexed;Wide base of support;Antalgic Gait velocity: decreased Gait velocity interpretation: <1.31 ft/sec, indicative of household ambulator General Gait Details: Patient able to ambulate short distance to 3N1.   Then ambulated to recliner.    Stairs             Wheelchair Mobility    Modified Rankin (Stroke Patients Only)       Balance Overall balance assessment: Needs assistance Sitting-balance support: No upper extremity supported;Feet supported Sitting balance-Leahy Scale: Fair     Standing balance support: Bilateral upper extremity supported;During functional activity Standing balance-Leahy Scale: Poor Standing balance comment: reliant on BUE support and external support for stability standing                            Cognition Arousal/Alertness: Awake/alert Behavior During Therapy: WFL for tasks assessed/performed Overall Cognitive Status: Within Functional Limits for tasks assessed                                        Exercises General Exercises - Lower Extremity Ankle Circles/Pumps: 20 reps;AROM;Supine;Both Long Arc Quad: AROM;Both;10 reps;Seated    General Comments        Pertinent Vitals/Pain Pain Assessment: No/denies pain    Home Living                      Prior Function            PT Goals (current goals can now be found in the care plan section) Acute Rehab PT Goals Patient Stated Goal: go home Progress towards PT goals: Progressing toward goals    Frequency  Min 3X/week      PT Plan Discharge plan needs to be updated    Co-evaluation              AM-PAC PT "6 Clicks" Daily Activity  Outcome Measure  Difficulty turning over in bed (including adjusting bedclothes, sheets and blankets)?: A Little Difficulty moving from lying on back to sitting on the side of the bed? : A Little Difficulty sitting down on and standing up from a chair with arms (e.g., wheelchair, bedside commode, etc,.)?: A Little Help needed moving to and from a bed to chair (including a wheelchair)?: A Little Help needed walking in hospital room?: A Little Help needed climbing 3-5 steps with a railing? : Total 6 Click  Score: 16    End of Session Equipment Utilized During Treatment: Gait belt;Oxygen(3L) Activity Tolerance: Patient limited by fatigue Patient left: in chair;with call bell/phone within reach;with chair alarm set Nurse Communication: Mobility status PT Visit Diagnosis: Unsteadiness on feet (R26.81);Muscle weakness (generalized) (M62.81)     Time: 1610-9604 PT Time Calculation (min) (ACUTE ONLY): 13 min  Charges:  $Gait Training: 8-22 mins                    G Codes:       Tyerra Loretto,PT Acute Rehabilitation [phone] (pager)    Berline Lopes 07/10/2017, 1:48 PM

## 2017-07-10 NOTE — Care Management Important Message (Signed)
Important Message  Patient Details  Name: Brett FairyBarbara S Corriher MRN: [id number] Date of Birth: [birth year]   Medicare Important Message Given:  Yes    Tonatiuh Mallon P Marcene Laskowski 07/10/2017, 3:41 PM

## 2017-07-11 ENCOUNTER — Encounter (HOSPITAL_COMMUNITY): Payer: Self-pay | Admitting: Physician Assistant

## 2017-07-11 DIAGNOSIS — I5032 Chronic diastolic (congestive) heart failure: Secondary | ICD-10-CM

## 2017-07-11 DIAGNOSIS — N183 Chronic kidney disease, stage 3 unspecified: Secondary | ICD-10-CM

## 2017-07-11 DIAGNOSIS — I480 Paroxysmal atrial fibrillation: Secondary | ICD-10-CM

## 2017-07-11 LAB — BASIC METABOLIC PANEL
ANION GAP: 11 (ref 5–15)
BUN: 32 mg/dL — ABNORMAL HIGH (ref 6–20)
CHLORIDE: 96 mmol/L — AB (ref 101–111)
CO2: 33 mmol/L — AB (ref 22–32)
Calcium: 8.6 mg/dL — ABNORMAL LOW (ref 8.9–10.3)
Creatinine, Ser: 1.24 mg/dL — ABNORMAL HIGH (ref 0.44–1.00)
GFR calc Af Amer: 46 mL/min — ABNORMAL LOW (ref >60)
GFR calc non Af Amer: 40 mL/min — ABNORMAL LOW (ref >60)
GLUCOSE: 156 mg/dL — AB (ref 65–99)
POTASSIUM: 3.5 mmol/L (ref 3.5–5.1)
Sodium: 140 mmol/L (ref 135–145)

## 2017-07-11 LAB — CBC
HCT: 27.3 % — ABNORMAL LOW (ref 36.0–46.0)
HEMOGLOBIN: 8.3 g/dL — AB (ref 12.0–15.0)
MCH: 25.6 pg — ABNORMAL LOW (ref 26.0–34.0)
MCHC: 30.4 g/dL (ref 30.0–36.0)
MCV: 84.3 fL (ref 78.0–100.0)
Platelets: 82 10*3/uL — ABNORMAL LOW (ref 150–400)
RBC: 3.24 MIL/uL — AB (ref 3.87–5.11)
RDW: 16 % — ABNORMAL HIGH (ref 11.5–15.5)
WBC: 4.5 10*3/uL (ref 4.0–10.5)

## 2017-07-11 LAB — GLUCOSE, CAPILLARY
Glucose-Capillary: 156 mg/dL — ABNORMAL HIGH (ref 65–99)
Glucose-Capillary: 199 mg/dL — ABNORMAL HIGH (ref 65–99)
Glucose-Capillary: 242 mg/dL — ABNORMAL HIGH (ref 65–99)

## 2017-07-11 MED ORDER — APIXABAN 5 MG PO TABS: 5.0000 mg | ORAL_TABLET | Freq: Two times a day (BID) | ORAL | 11 refills | Status: DC

## 2017-07-11 NOTE — Progress Notes (Signed)
Patient will discharge to [facility] [place] Anticipated discharge date: 07/11/17 Family notified: Jacinto HalimSandy Yowell, daughter in [place] Transportation by: PTAR  Nurse to call report to [phone]. Patient will go to room 607 at the facility.   CSW signing off.  Abigail ButtsSusan Monserratt Knezevic, LCSWA  Clinical Social Worker

## 2017-07-11 NOTE — Progress Notes (Addendum)
1:50 pm Patient's family has [facility] and [facility] Facility ready to admit patient today. Paged PA.   11:15 am CSW coordinating discharge to SNF. [facility] [place] and [facility]; patient now in co-pay days. Patient also had significant outstanding bill with [facility].  Patient and family indicated they only want [facility] [place]. CSW provided alternate bed offers and patient and family declined. [facility] would not offer another bed with outstanding bill. Family agreeable to go pay bill today at the facility. [facility] will offer bed again once bill is paid.  [facility] once bill paid - awaiting new auth. Patient and family aware patient will still have co-pays for this admission to SNF. CSW following and will support with discharge planning.  Abigail ButtsSusan Kristan Votta, LCSWA [phone]

## 2017-07-11 NOTE — Progress Notes (Addendum)
Progress Note  Patient Name: Alicia [name] Date of Encounter: 07/11/2017  Primary Cardiologist: Dr. Burt Knack   Subjective   Had nose bleed, likely from irritation of nasal cannula. Will take off. Cold compression and humidity. Breathing better. PT/OT recommended SNF vs 24 hours assistant.   Inpatient Medications    Scheduled Meds: . apixaban  5 mg Oral BID  . atorvastatin  40 mg Oral Daily  . Chlorhexidine Gluconate Cloth  6 each Topical Daily  . furosemide  40 mg Oral Daily  . insulin aspart  0-5 Units Subcutaneous QHS  . insulin aspart  0-9 Units Subcutaneous TID WC  . levETIRAcetam  750 mg Oral BID  . levothyroxine  75 mcg Oral QAC breakfast  . oxybutynin  5 mg Oral Daily  . sodium chloride flush  10-40 mL Intracatheter Q12H   Continuous Infusions:  PRN Meds: acetaminophen, ALPRAZolam, nitroGLYCERIN, ondansetron (ZOFRAN) IV, sodium chloride flush   Vital Signs    Vitals:   07/10/17 1402 07/10/17 2121 07/11/17 0405 07/11/17 0405  BP: (!) 122/58 124/61  (!) 113/53  Pulse: 69 67  78  Resp: 18     Temp: 98.5 F (36.9 C) 98.8 F (37.1 C)  97.8 F (36.6 C)  TempSrc: Oral Oral  Oral  SpO2: 99% 99%  100%  Weight:   205 lb 7.5 oz (93.2 kg)   Height:        Intake/Output Summary (Last 24 hours) at 07/11/2017 0850 Last data filed at 07/10/2017 1700 Gross per 24 hour  Intake 720 ml  Output 750 ml  Net -30 ml   Filed Weights   07/09/17 0521 07/10/17 0610 07/11/17 0405  Weight: 203 lb 7.8 oz (92.3 kg) 204 lb 9.4 oz (92.8 kg) 205 lb 7.5 oz (93.2 kg)    Telemetry    afib at 80s- Personally Reviewed  ECG    N/A  Physical Exam   GEN:  Elderly frail ill-appearing female in no acute distress.   Neck: No JVD Cardiac:  Irregularly irregular, 3 out of 6 systolic murmur, rubs, or gallops.  Respiratory: Clear to auscultation bilaterally. GI: Soft, nontender, non-distended  MS: No edema; No deformity. Neuro:  Nonfocal  Psych: Normal affect   Labs     Chemistry Recent Labs  Lab 07/06/17 2110  07/09/17 0436 07/10/17 0624 07/11/17 0445  NA 141   < > 140 139 140  K 4.0   < > 3.9 3.8 3.5  CL 98*   < > 95* 96* 96*  CO2 32   < > 34* 34* 33*  GLUCOSE 212*   < > 163* 156* 156*  BUN 39*   < > 22* 23* 32*  CREATININE 1.07*   < > 1.22* 1.13* 1.24*  CALCIUM 9.7   < > 9.6 9.2 8.6*  PROT 6.8  --   --   --   --   ALBUMIN 2.8*  --   --   --   --   AST 47*  --   --   --   --   ALT 48  --   --   --   --   ALKPHOS 61  --   --   --   --   BILITOT 0.5  --   --   --   --   GFRNONAA 48*   < > 41* 45* 40*  GFRAA 55*   < > 47* 52* 46*  ANIONGAP 11   < > 11  9 11   < > = values in this interval not displayed.     Hematology Recent Labs  Lab 07/09/17 0436 07/10/17 0624 07/11/17 0445  WBC 6.8 6.4 4.5  RBC 3.70* 3.82* 3.24*  HGB 9.5* 9.7* 8.3*  HCT 31.0* 32.2* 27.3*  MCV 83.8 84.3 84.3  MCH 25.7* 25.4* 25.6*  MCHC 30.6 30.1 30.4  RDW 16.1* 16.1* 16.0*  PLT 134* 101* 82*    Cardiac Enzymes Recent Labs  Lab 07/06/17 2110 07/07/17 0459 07/07/17 0810  TROPONINI <0.03 <0.03 0.03*   No results for input(s): TROPIPOC in the last 168 hours.   BNP Recent Labs  Lab 07/06/17 2110  BNP 328.5*    Radiology    No results found.  Cardiac Studies   Echocardiogram 07/07/2017  - Left ventricle: The cavity size was normal. Wall thickness was increased in a pattern of mild LVH. Systolic function was normal. The estimated ejection fraction was in the range of 55% to 60%. The study is not technically sufficient to allow evaluation of LV diastolic function. - Aortic valve: There was moderate stenosis. There was trivial regurgitation. Valve area (VTI): 0.92 cm^2. Valve area (Vmax): 0.88 cm^2. Valve area (Vmean): 0.91 cm^2. - Mitral valve: Severe MAC with moderate functional MS. The findings are consistent with moderate stenosis. Valve area by continuity equation (using LVOT flow): 1.49 cm^2. - Left atrium: The atrium was  moderately dilated. - Atrial septum: No defect or patent foramen ovale was identified.   Patient Profile     83 y.o. female with history of paroxysmal atrial fibrillation, aortic stenosis (not a candidate for TAVR per Dr. Burt Knack due to deconditioning), seizure disorder, DM2, CVA, hypothyroidism transferred from [facility] for evaluation of hypotension and shortness of breath.  She was treated with IV fluid and pubic MI at [facility] and transferred to [facility] for possible cardiogenic shock due to bradycardia.  Assessment & Plan    1.  Cardiogenic shock due to bradycardia -She was on Coreg 6.25 mg on admission which was discontinued.  Treated with dobutamine. -Hemodynamically stable now.  2.  Acute on chronic diastolic heart failure -She diuresed total 6.4 L since admission.  Weight has been stable ~204-205lb. breathing has been stable.  Renal function and BUN slightly worsened from yesterday.  Follow closely.  Continue Lasix. ?  Hold for day or reduced dose.  Euvolemic by exam.  3.  Paroxysmal atrial fibrillation -Patient is going in and out of A. fib since admission.  Coreg discontinued due to bradycardia on presentation.  Currently rate control at 80s to 90s while in atrial fibrillation.  Rate control versus rhythm control strategy per MD. -Continue Eliquis for anticoagulation.  She had nosebleeds this morning.  Conservative measures.  Taken off nasal cannula.  4.  Valvular heart disease -Echo showed moderate mitral stenosis and aortic stenosis.  Patient previously evaluated by Dr. Burt Knack and felt not a good candidate for TAVR due to deconditioning.  5.  Deconditioning -PT OT recommended SNF versus 24-hour assistant.  Case Freight forwarder and Education officer, museum on board.  Likely discharge to SNF in [place].   For questions or updates, please contact [place] Please consult [email or web address] for contact info under Cardiology/STEMI.      SignedLeanor Kail, PA  07/11/2017, 8:50 AM     Attending Note:   The patient was seen and examined.  Agree with assessment and plan as noted above.  Changes made to the above note as needed.  Patient seen and independently examined  with Robbie Lis, PA .   We discussed all aspects of the encounter. I agree with the assessment and plan as stated above.  1.   Bradycardia: She was bradycardic on carvedilol 6.25 mg twice a day.  She appears to have sick sinus syndrome.  She is doing well off of all AV nodal  blocking agents at this time.  She is very stable and is ready for discharge.  The social worker is looking into nursing home placement.  She will be able to go when a bed is available.  2.  Cardiogenic shock: This was secondary to her bradycardia.  She is not in heart failure at this time.  She is doing very well.  Continue current medications.  3.  Moderate mitral stenosis and aortic stenosis: She is not a candidate for TAVR due to profound deconditioning.  Continue medical therapy.    I have spent a total of 40 minutes with patient reviewing hospital  notes , telemetry, EKGs, labs and examining patient as well as establishing an assessment and plan that was discussed with the patient. > 50% of time was spent in direct patient care.    Thayer Headings, Brooke Bonito., MD, [facility] 07/11/2017, 10:47 AM [address]  [place] Pager [phone]

## 2017-07-11 NOTE — Discharge Instructions (Addendum)
Information on my medicine - ELIQUIS (apixaban)  Why was Eliquis prescribed for you? Eliquis was prescribed for you to reduce the risk of a blood clot forming that can cause a stroke if you have a medical condition called atrial fibrillation (a type of irregular heartbeat).  What do You need to know about Eliquis ? Take your Eliquis TWICE DAILY - one tablet in the morning and one tablet in the evening with or without food. If you have difficulty swallowing the tablet whole please discuss with your pharmacist how to take the medication safely.  Take Eliquis exactly as prescribed by your doctor and DO NOT stop taking Eliquis without talking to the doctor who prescribed the medication.  Stopping may increase your risk of developing a stroke.  Refill your prescription before you run out.  After discharge, you should have regular check-up appointments with your healthcare provider that is prescribing your Eliquis.  In the future your dose may need to be changed if your kidney function or weight changes by a significant amount or as you get older.  What do you do if you miss a dose? If you miss a dose, take it as soon as you remember on the same day and resume taking twice daily.  Do not take more than one dose of ELIQUIS at the same time to make up a missed dose.   Cardiogenic Shock Shock is a life-threatening condition. It is failure to get blood, oxygen, and nutrients to vital organs. Cardiogenic shock is a type of shock that happens when the heart fails to pump blood effectively through the body. This can occur if a damaged or weakened heart cannot pump blood. Cardiogenic shock is a medical emergency and requires immediate treatment. What are the causes? The underlying cause of this condition is heart failure. It may happen suddenly (acute) or gradually over time as many traumas or attacks damage the heart. Common acute causes of cardiogenic shock include:  Heart attack.  Built-up fluid  around the heart that accumulates quickly.  Trauma to the heart.  Heart valve problems.  Certain medicines, including medicines that are poisonous (toxic) to the heart.  A tumor in the heart.  Infection of the heart muscle.  A blood clot that has traveled to a lung (pulmonary embolism).  Abnormal heart rhythm.  Viral infection of the heart.  Inflammation of the heart muscle.  Pump failure and cardiogenic shock can develop over time in people who have:  High blood pressure (hypertension).  Diabetes.  A history of heart attacks or other heart problems.  Coronary artery disease.  Exposure to medicines that are toxic to the heart.  Recurrent pulmonary embolism.  Genetic defects (abnormalities) in the heart muscle.  What are the signs or symptoms? Signs and symptoms of this condition include:  Low blood pressure (hypotension).  Sweating.  Urinating less often than normal.  Nausea.  Fainting.  Weakness.  Pale skin.  Cold hands or feet.  Shallow, quick breathing, or shortness of breath.  Confusion.  Weak pulse.  How is this diagnosed? This condition may be diagnosed by:  Performing a physical exam.  Taking your medical history.  Doing tests, including: ? A check of your blood pressure, pulse, breathing rate, and temperature. ? Blood tests. ? Electrocardiogram (ECG). ? Chest X-ray. ? Echocardiogram. ? Cardiac catheterization. ? Coronary angiogram, which is an X-ray of your heart and blood vessels.  How is this treated? Cardiogenic shock is a life-threatening condition that requires immediate medical care.  Treatment helps to get blood flowing through your body again. Treatment may include:  Medicine.  Oxygen given through a mask or a tube.  Use of a machine to help you breathe.  Use of a machine to help your heart pump.  Surgery.  Get help right away if:  You feel light-headed or dizzy.  You pass out.  You suddenly start to sweat  or your skin gets clammy.  You feel nauseous or you vomit.  You have shortness of breath.  Your skin is pale. Summary  Shock is a life-threatening condition. It requires immediate medical care.  The underlying cause of this condition is heart failure.  Treatment helps to get blood flowing through your body again. This information is not intended to replace advice given to you by your health care provider. Make sure you discuss any questions you have with your health care provider. Document Released: 11/03/2007 Document Revised: 12/14/2015 Document Reviewed: 12/14/2015 Elsevier Interactive Patient Education  2017 Elsevier Inc.   Important Safety Information A possible side effect of Eliquis is bleeding. You should call your healthcare provider right away if you experience any of the following: ? Bleeding from an injury or your nose that does not stop. ? Unusual colored urine (red or dark brown) or unusual colored stools (red or black). ? Unusual bruising for unknown reasons. ? A serious fall or if you hit your head (even if there is no bleeding).  Some medicines may interact with Eliquis and might increase your risk of bleeding or clotting while on Eliquis. To help avoid this, consult your healthcare provider or pharmacist prior to using any new prescription or non-prescription medications, including herbals, vitamins, non-steroidal anti-inflammatory drugs (NSAIDs) and supplements.  This website has more information on Eliquis (apixaban): http://www.eliquis.com/eliquis/home     Heart-Healthy Eating Plan Heart-healthy meal planning includes:  Limiting unhealthy fats.  Increasing healthy fats.  Making other small dietary changes.  You may need to talk with your doctor or a diet specialist (dietitian) to create an eating plan that is right for you. What types of fat should I choose?  Choose healthy fats. These include olive oil and canola oil, flaxseeds, walnuts,  almonds, and seeds.  Eat more omega-3 fats. These include salmon, mackerel, sardines, tuna, flaxseed oil, and ground flaxseeds. Try to eat fish at least twice each week.  Limit saturated fats. ? Saturated fats are often found in animal products, such as meats, butter, and cream. ? Plant sources of saturated fats include palm oil, palm kernel oil, and coconut oil.  Avoid foods with partially hydrogenated oils in them. These include stick margarine, some tub margarines, cookies, crackers, and other baked goods. These contain trans fats. What general guidelines do I need to follow?  Check food labels carefully. Identify foods with trans fats or high amounts of saturated fat.  Fill one half of your plate with vegetables and green salads. Eat 4-5 servings of vegetables per day. A serving of vegetables is: ? 1 cup of raw leafy vegetables. ?  cup of raw or cooked cut-up vegetables. ?  cup of vegetable juice.  Fill one fourth of your plate with whole grains. Look for the word "whole" as the first word in the ingredient list.  Fill one fourth of your plate with lean protein foods.  Eat 4-5 servings of fruit per day. A serving of fruit is: ? One medium whole fruit. ?  cup of dried fruit. ?  cup of fresh, frozen, or canned fruit. ?  cup of 100% fruit juice.  Eat more foods that contain soluble fiber. These include apples, broccoli, carrots, beans, peas, and barley. Try to get 20-30 g of fiber per day.  Eat more home-cooked food. Eat less restaurant, buffet, and fast food.  Limit or avoid alcohol.  Limit foods high in starch and sugar.  Avoid fried foods.  Avoid frying your food. Try baking, boiling, grilling, or broiling it instead. You can also reduce fat by: ? Removing the skin from poultry. ? Removing all visible fats from meats. ? Skimming the fat off of stews, soups, and gravies before serving them. ? Steaming vegetables in water or broth.  Lose weight if you are  overweight.  Eat 4-5 servings of nuts, legumes, and seeds per week: ? One serving of dried beans or legumes equals  cup after being cooked. ? One serving of nuts equals 1 ounces. ? One serving of seeds equals  ounce or one tablespoon.  You may need to keep track of how much salt or sodium you eat. This is especially true if you have high blood pressure. Talk with your doctor or dietitian to get more information. What foods can I eat? Grains Breads, including [place], white, pita, wheat, raisin, rye, oatmeal, and [place]. Tortillas that are neither fried nor made with lard or trans fat. Low-fat rolls, including hotdog and hamburger buns and English muffins. Biscuits. Muffins. Waffles. Pancakes. Light popcorn. Whole-grain cereals. Flatbread. Melba toast. Pretzels. Breadsticks. Rusks. Low-fat snacks. Low-fat crackers, including oyster, saltine, matzo, graham, animal, and rye. Rice and pasta, including brown rice and pastas that are made with whole wheat. Vegetables All vegetables. Fruits All fruits, but limit coconut. Meats and Other Protein Sources Lean, well-trimmed beef, veal, pork, and lamb. Chicken and [place] without skin. All fish and shellfish. Wild duck, rabbit, pheasant, and venison. Egg whites or low-cholesterol egg substitutes. Dried beans, peas, lentils, and tofu. Seeds and most nuts. Dairy Low-fat or nonfat cheeses, including ricotta, string, and mozzarella. Skim or 1% milk that is liquid, powdered, or evaporated. Buttermilk that is made with low-fat milk. Nonfat or low-fat yogurt. Beverages Mineral water. Diet carbonated beverages. Sweets and Desserts Sherbets and fruit ices. Honey, jam, marmalade, jelly, and syrups. Meringues and gelatins. Pure sugar candy, such as hard candy, jelly beans, gumdrops, mints, marshmallows, and small amounts of dark chocolate. [employer]. Eat all sweets and desserts in moderation. Fats and Oils Nonhydrogenated (trans-free) margarines.  Vegetable oils, including soybean, sesame, sunflower, olive, peanut, safflower, corn, canola, and cottonseed. Salad dressings or mayonnaise made with a vegetable oil. Limit added fats and oils that you use for cooking, baking, salads, and as spreads. Other Cocoa powder. Coffee and tea. All seasonings and condiments. The items listed above may not be a complete list of recommended foods or beverages. Contact your dietitian for more options. What foods are not recommended? Grains Breads that are made with saturated or trans fats, oils, or whole milk. Croissants. Butter rolls. Cheese breads. Sweet rolls. Donuts. Buttered popcorn. Chow mein noodles. High-fat crackers, such as cheese or butter crackers. Meats and Other Protein Sources Fatty meats, such as hotdogs, short ribs, sausage, spareribs, bacon, rib eye roast or steak, and mutton. High-fat deli meats, such as salami and bologna. Caviar. Domestic duck and goose. Organ meats, such as kidney, liver, sweetbreads, and heart. Dairy Cream, sour cream, cream cheese, and creamed cottage cheese. Whole-milk cheeses, including blue (bleu), [address], [place], [place], [address], [place], [address], cheddar, [place], and [place]. Whole or 2% milk  that is liquid, evaporated, or condensed. Whole buttermilk. Cream sauce or high-fat cheese sauce. Yogurt that is made from whole milk. Beverages Regular sodas and juice drinks with added sugar. Sweets and Desserts Frosting. Pudding. Cookies. Cakes other than angel food cake. Candy that has milk chocolate or white chocolate, hydrogenated fat, butter, coconut, or unknown ingredients. Buttered syrups. Full-fat ice cream or ice cream drinks. Fats and Oils Gravy that has suet, meat fat, or shortening. Cocoa butter, hydrogenated oils, palm oil, coconut oil, palm kernel oil. These can often be found in baked products, candy, fried foods, nondairy creamers, and whipped toppings. Solid fats and shortenings, including bacon fat,  salt pork, lard, and butter. Nondairy cream substitutes, such as coffee creamers and sour cream substitutes. Salad dressings that are made of unknown oils, cheese, or sour cream. The items listed above may not be a complete list of foods and beverages to avoid. Contact your dietitian for more information. This information is not intended to replace advice given to you by your health care provider. Make sure you discuss any questions you have with your health care provider. Document Released: 07/26/2011 Document Revised: 07/02/2015 Document Reviewed: 07/18/2013 Elsevier Interactive Patient Education  [employer]2018 Elsevier Inc.

## 2017-07-11 NOTE — Clinical Social Work Placement (Signed)
   CLINICAL SOCIAL WORK PLACEMENT  NOTE  Date:  07/11/2017  Patient Details  Name: Alicia FairyBarbara S Purkey MRN: [id number] Date of Birth: 1934-10-13  Clinical Social Work is seeking post-discharge placement for this patient at the Skilled  Nursing Facility level of care (*CSW will initial, date and re-position this form in  chart as items are completed):  Yes   Patient/family provided with [place] Clinical Social Work Department's list of facilities offering this level of care within the geographic area requested by the patient (or if unable, by the patient's family).  Yes   Patient/family informed of their freedom to choose among providers that offer the needed level of care, that participate in Medicare, Medicaid or managed care program needed by the patient, have an available bed and are willing to accept the patient.  Yes   Patient/family informed of 's [facility] interest [facility], as well as of the fact that they are under no obligation to receive care at these facilities.  PASRR submitted to EDS on       PASRR number received on       Existing PASRR number confirmed on 07/10/17     FL2 transmitted to all facilities in geographic area requested by pt/family on 07/10/17     FL2 transmitted to all facilities within larger geographic area on       Patient informed that his/her managed care company has contracts with or will negotiate with certain facilities, including the following:  [facility], [place]     Yes   Patient/family informed of bed offers received.  [facility]     Physician recommends and patient chooses bed at      Patient to be transferred to [facility], [place] on 07/11/17.  Patient to be transferred to facility by [facility]     Patient family notified on 07/11/17 of transfer.  Name of family member notified:  Jacinto HalimSandy Santaana, daughter in law     PHYSICIAN       Additional Comment:     _______________________________________________ Abigail ButtsSusan Josten [place], LCSW 07/11/2017, 2:48 PM

## 2017-07-11 NOTE — Progress Notes (Addendum)
Patient having slight nose bleed.  Cold washcloth and pressure applied. Patient is on O2 [place]  Will continue to monitor.    Spoke to Borders GroupVin Bhagat, [place]PA, stated to continue eliquis.    02 sats 89-90% on RA per NT Nay, humidification added to oxygen by RN.  O2 [place] re applied, sats up 100% on 3.5L O2

## 2017-07-11 NOTE — Progress Notes (Signed)
Notified by CCMD  Pt back in Afib w/controlled rate. Will continue to monitor. Dierdre HighmanHall, Symphony Demuro Marie, RN

## 2017-07-11 NOTE — Discharge Summary (Addendum)
Discharge Summary    Patient ID: Alicia [name],  MRN: [id number], DOB/AGE: 03-21-1934 83 y.o.  Admit date: 07/06/2017 Discharge date: 07/11/2017  Primary Care Provider: Gilford Rile A. Primary Cardiologist: Dr. Burt Knack (long term will be followed at [facility])  Discharge Diagnoses    Principal Problem:   Cardiogenic shock [facility]) Active Problems:   CKD (chronic kidney disease), stage III (HCC)   Acute on chronic diastolic heart failure (HCC)   PAF (paroxysmal atrial fibrillation) (HCC)   Moderate mitral stenosis and aortic stenosis:    DM   HTN   Seizure disorder  Allergies No Known Allergies  Diagnostic Studies/Procedures    Echocardiogram 07/07/2017 - Left ventricle: The cavity size was normal. Wall thickness was increased in a pattern of mild LVH. Systolic function was normal. The estimated ejection fraction was in the range of 55% to 60%. The study is not technically sufficient to allow evaluation of LV diastolic function. - Aortic valve: There was moderate stenosis. There was trivial regurgitation. Valve area (VTI): 0.92 cm^2. Valve area (Vmax): 0.88 cm^2. Valve area (Vmean): 0.91 cm^2. - Mitral valve: Severe MAC with moderate functional MS. The findings are consistent with moderate stenosis. Valve area by continuity equation (using LVOT flow): 1.49 cm^2. - Left atrium: The atrium was moderately dilated. - Atrial septum: No defect or patent foramen ovale was identified.      History of Present Illness     83 y.o. female with history of paroxysmal atrial fibrillation, aortic stenosis, seizure disorder, DM2, CVA, hypothyroidism transferred from [facility] for evaluation of hypotension and shortness of breath.  Feb 2019 episode of syncope in setting of GI illness. During ER evaluation found to have AKI, thought to be related to dehydration at [facility]. Echo at that time shoed LVEF 14%, diastolic dysfunction, AS with mean grad 33  and dimensionless index 0.35. She was discharged to rehab, however had an episode of unresponsiveness and taken back to hospital. Witnessed seizure at the time, required intubation and transferred to [facility] at that time and later discharged. Seen by Dr Burt Knack in valve clinic, plan was for continued recovery from her severe deconditioning related to prolonged hospital stays and continued monitoring of her moderate AS.   She was admitted 07/04/17 at [facility] with hypotension with reported SBP in the 70s and heart rates in the 40s. From notes patient was initialy hypothermic to 88.2, CXR with infiltrate vs atelectasis, normal calcitonin. This was not thought to be sepsis given normal calcitonin level, negative UA, nonacute CXR, and negative blood cx's at 24 hrs. Was transiently on broad spectrum abx but stopped. From notes cardiology was consulted and recommended starting dobutamine and Iv fluids which improved bp's and heart rates significantly, there is concern about possible cardiogenic shock and transferred to [facility] for further evaluation.   Hospital Course     Consultants: CHF  1.  Cardiogenic shock due to bradycardia -She was on Coreg 6.25 mg on admission which was discontinued.  Treated with dobutamine and fluids with improved BP and HR. Hemodynamically stable now. Restart coreg at low dose as outpatient if stable vitals.   2.  Acute on chronic diastolic heart failure -She diuresed total 6.9 L since admission.  Weight has been stable ~204-205lb. Breathing has been stable.  Renal function and BUN slightly worsened from yesterday (23/1.13>>32/1.24).  Euvolemic by exam. Continue home dose of lasix 23m qd. Recheck BMEt in 3 day and adjust dose.   3.  Paroxysmal atrial  fibrillation -Patient is going in and out of A. fib since admission.  Coreg discontinued due to bradycardia on presentation.  Currently rate control at 80s to 90s while in atrial fibrillation.  Not on any Rate control or  rhythm control medication currently. Consider as outpatient as needed.  -Continue Eliquis for anticoagulation.  She had nosebleeds. Conservative measures.  Taken off nasal cannula.  4.  Valvular heart disease -Echo showed moderate mitral stenosis and aortic stenosis.  Patient previously evaluated by Dr. Burt Knack and felt not a good candidate for TAVR at that time due to deconditioning. Has follow up in upcoming weeks.   5.  Deconditioning -PT OT recommended SNF versus 24-hour assistant. Discharge to SNF in [place].   6. DM - SSI here. Resume home medications at discharge.   The patient has been seen by Dr. Acie Fredrickson today and deemed ready for discharge home. All follow-up appointments have been scheduled. Discharge medications are listed below.    Discharge Vitals Blood pressure (!) 114/55, pulse 61, temperature 98.4 F (36.9 C), temperature source Oral, resp. rate 18, height _0  (1.626 m), weight 205 lb 7.5 oz (93.2 kg), SpO2 100 %.  Filed Weights   07/09/17 0521 07/10/17 0610 07/11/17 0405  Weight: 203 lb 7.8 oz (92.3 kg) 204 lb 9.4 oz (92.8 kg) 205 lb 7.5 oz (93.2 kg)    Labs & Radiologic Studies     CBC Recent Labs    07/10/17 0624 07/11/17 0445  WBC 6.4 4.5  HGB 9.7* 8.3*  HCT 32.2* 27.3*  MCV 84.3 84.3  PLT 101* 82*   Basic Metabolic Panel Recent Labs    07/10/17 0624 07/11/17 0445  NA 139 140  K 3.8 3.5  CL 96* 96*  CO2 34* 33*  GLUCOSE 156* 156*  BUN 23* 32*  CREATININE 1.13* 1.24*  CALCIUM 9.2 8.6*  MG 1.6*  --     Dg Chest Port 1 View  Result Date: 07/06/2017 CLINICAL DATA:  Acute onset of shortness of breath. PICC placement. EXAM: PORTABLE CHEST 1 VIEW COMPARISON:  Chest radiograph performed 07/04/2017 FINDINGS: There is elevation of the right hemidiaphragm. Bibasilar airspace opacities may reflect atelectasis or pneumonia. Small bilateral pleural effusions are suspected. There is no evidence of pneumothorax. The cardiomediastinal silhouette is  borderline enlarged. Cervical spinal fusion hardware is noted. No acute osseous abnormalities are seen. IMPRESSION: 1. Elevation of the right hemidiaphragm. Bibasilar airspace opacities may reflect atelectasis or pneumonia. Small bilateral pleural effusions suspected. 2. Borderline cardiomegaly. Electronically Signed   By: Garald Balding M.D.   On: 07/06/2017 22:40   [place] Ekg Site Rite  Result Date: 07/06/2017 If Site Rite image not attached, placement could not be confirmed due to current cardiac rhythm.   Disposition   Pt is being discharged home today in good condition.  Follow-up Plans & Appointments    Follow-up Information    Sherren Mocha, MD. Go on 08/07/2017.   Specialty:  Cardiology Why:  _1  for 3 month and hospital follo wp Contact information: [address] [address] [phone]        Raina Mina., MD Follow up in 4 day(s).   Specialty:  Internal Medicine Why:  for hosptial follow up with BMET Contact information: [place] [address] [phone]          Discharge Instructions    Diet - low sodium heart healthy   Complete by:  As directed    Discharge instructions  Complete by:  As directed    *Weigh yourself on the same scale at same time of day and keep a log. *Report weight gain of > 3 bs in 1 day or 5 lbs over the course of a week and/or symptoms of excess fluid (shortness of breath, difficulty lying flat, swelling, poor appetite, abdominal fullness/bloating, etc) to your doctor immediately. *Avoid foods that are high in sodium (processed, pre-packaged/canned goods, fast foods, etc). *Please attend all scheduled and reccommended follow up appointments   Increase activity slowly   Complete by:  As directed       Discharge Medications   Allergies as of 07/11/2017   No Known Allergies     Medication List    STOP taking these medications   aspirin 81 MG chewable tablet   carvedilol 6.25 MG  tablet Commonly known as:  COREG   felodipine 5 MG 24 hr tablet Commonly known as:  PLENDIL     TAKE these medications   alendronate 70 MG tablet Commonly known as:  FOSAMAX Take 70 mg by mouth every 7 (seven) days.   allopurinol 100 MG tablet Commonly known as:  ZYLOPRIM Take 100 mg by mouth daily.   ALPRAZolam 1 MG tablet Commonly known as:  XANAX Take 0.5-1 mg by mouth every 8 (eight) hours as needed for anxiety.   apixaban 5 MG Tabs tablet Commonly known as:  ELIQUIS Take 1 tablet (5 mg total) by mouth 2 (two) times daily.   atorvastatin 40 MG tablet Commonly known as:  LIPITOR Take 40 mg by mouth daily.   furosemide 40 MG tablet Commonly known as:  LASIX Take 1 tablet (40 mg total) by mouth daily.   glipiZIDE 10 MG 24 hr tablet Commonly known as:  GLUCOTROL XL Take 10 mg by mouth 2 (two) times daily.   levETIRAcetam 750 MG tablet Commonly known as:  KEPPRA Take 750 mg by mouth 2 (two) times daily.   levothyroxine 75 MCG tablet Commonly known as:  SYNTHROID, LEVOTHROID Take 75 mcg by mouth daily.   metFORMIN 500 MG 24 hr tablet Commonly known as:  GLUCOPHAGE-XR Take 500 mg by mouth 2 (two) times daily.   ONGLYZA 5 MG Tabs tablet Generic drug:  saxagliptin HCl Take 5 mg by mouth daily.   pioglitazone 15 MG tablet Commonly known as:  ACTOS Take 15 mg by mouth daily.       Outstanding Labs/Studies   BMET in 3 days.   Duration of Discharge Encounter   Greater than 30 minutes including physician time.  Signed, Crista Luria Bhagat PA-C 07/11/2017, 2:12 PM  Attending Note:   The patient was seen and examined.  Agree with assessment and plan as noted above.  Changes made to the above note as needed.  Patient seen and independently examined with Robbie Lis, PA .   We discussed all aspects of the encounter. I agree with the assessment and plan as stated above.  1.   Cardiogenic shock - relate to bradycardia . Much better off the coreg.  Feeling  well  Will folliow up in our [place] office.  Also see progress note from same day     I have spent a total of 40 minutes with patient reviewing hospital  notes , telemetry, EKGs, labs and examining patient as well as establishing an assessment and plan that was discussed with the patient. > 50% of time was spent in direct patient care.     Thayer Headings, Brooke Bonito., MD, [facility] 07/12/2017,  3:36 PM [address]  [place] Pager [phone]

## 2017-07-12 LAB — CULTURE, BLOOD (ROUTINE X 2)
CULTURE: NO GROWTH
Culture: NO GROWTH
Special Requests: ADEQUATE
Special Requests: ADEQUATE

## 2017-07-13 LAB — GLUCOSE, CAPILLARY: Glucose-Capillary: 205 mg/dL — ABNORMAL HIGH (ref 65–99)

## 2017-08-07 ENCOUNTER — Other Ambulatory Visit (HOSPITAL_COMMUNITY): Payer: Medicare Other

## 2017-08-07 ENCOUNTER — Ambulatory Visit: Payer: Medicare Other | Admitting: Cardiovascular Disease

## 2017-09-06 DIAGNOSIS — I959 Hypotension, unspecified: Secondary | ICD-10-CM | POA: Insufficient documentation

## 2017-09-06 DIAGNOSIS — I050 Rheumatic mitral stenosis: Secondary | ICD-10-CM | POA: Insufficient documentation

## 2017-09-06 DIAGNOSIS — I350 Nonrheumatic aortic (valve) stenosis: Secondary | ICD-10-CM | POA: Insufficient documentation

## 2017-09-06 NOTE — Progress Notes (Signed)
Cardiology Office Note:    Date:  09/07/2017   ID:  Alicia [name], DOB 29-Feb-1934, MRN [id number]  PCP:  Alicia Payment., MD  Cardiologist:  Alicia Herrlich, MD    Referring MD: Alicia Payment., MD    ASSESSMENT:    1. Chronic diastolic heart failure (HCC)   2. Aortic stenosis, moderate   3. Non-rheumatic mitral valve stenosis   4. Hypotension due to drugs   5. PAF (paroxysmal atrial fibrillation) (HCC)   6. Chronic anticoagulation   7. Type 2 diabetes mellitus with complication, with long-term current use of insulin (HCC)    PLAN:    In order of problems listed above:  1. Stable compensated continue current loop diuretic avoid medicines inducing hypotension like carvedilol look at other options for diabetes other than Actos with her heart failure and take extra diuretic for weights greater than 205. 2. Stable recheck echocardiogram in 6 months if it becomes severe hemodynamically she would benefit from intervention TAVR 3. Stable rarely requires intervention 4. Resolved goals keep systolic blood pressure greater than 110-1 20 5. Stable continue metoprolol to avoid hypotension and reduced anticoagulant dose 6. Continue anticoagulant reduced to ABC dose 7. Stable managed by her PCP consider alternative to Actos with her recent decompensated heart failure admission   Next appointment: 3 months   Medication Adjustments/Labs and Tests Ordered: Current medicines are reviewed at length with the patient today.  Concerns regarding medicines are outlined above.  No orders of the defined types were placed in this encounter.  No orders of the defined types were placed in this encounter.   Chief Complaint  Patient presents with  . Transitions Of Care  . Chronic Kidney Disease  . Aortic Stenosis  . Mitral Stenosis  . Atrial Fibrillation  . Hypotension  . Anticoagulation    History of Present Illness:    Alicia [name] is a 83 y.o. female with a hx of heart failure,  moderate aortic and functional mitral stenosis, paroxysmal atrial fibrillation, aortic stenosis  Mean gradient (S): 22 mm Hg. Peak gradient (S): 43 mm Hg., seizure disorder, DM2, CVA, hypothyroidism and CKD  last seen as an inpatient at [facility] June 2019 with hypotension and decompensated heart failure. Compliance with diet, lifestyle and medications: Yes she is well supervised by her daughter  She is managed to succeed with rehab and returned home and weights have been stable between 201 and 205 pounds.  Her diet and medications are supervised she has had a no edema shortness of breath chest pain palpitation or syncope.  I reviewed medications with the daughter with her age and abnormal kidney function next prescription she will reduce her anticoagulant to one half dose and with heart failure exacerbation and hospitalization I will ask her PCP to review whether Actos can be discontinued.  Valvular heart disease is not severe and at this time I would not recommend intervention surgically or TAVR.  She has had no hypotension chest pain palpitation or syncope Past Medical History:  Diagnosis Date  . Aortic stenosis   . CHF (congestive heart failure) (HCC)    Diastolic CHF, aortic valve disease  . Chronic kidney disease   . CKD (chronic kidney disease), stage III (HCC)   . Diabetes mellitus without complication (HCC)    Type II  DM  . Heart murmur   . Hypertension   . Hypothyroidism   . Seizure (HCC)   . Stroke (HCC)   . Syncope and collapse  occurred in setting of GI illness, UTI, seizure   . Urinary incontinence     No past surgical history on file.  Current Medications: Current Meds  Medication Sig  . allopurinol (ZYLOPRIM) 100 MG tablet Take 100 mg by mouth daily.  Marland Kitchen ALPRAZolam (XANAX) 1 MG tablet Take 0.5-1 mg by mouth every 8 (eight) hours as needed for anxiety.  Marland Kitchen apixaban (ELIQUIS) 5 MG TABS tablet Take 1 tablet (5 mg total) by mouth 2 (two) times daily.  Marland Kitchen atorvastatin (LIPITOR) 40  MG tablet Take 40 mg by mouth daily.  . furosemide (LASIX) 40 MG tablet Take 1 tablet (40 mg total) by mouth daily.  Marland Kitchen glipiZIDE (GLUCOTROL XL) 10 MG 24 hr tablet Take 10 mg by mouth 2 (two) times daily.  Marland Kitchen levETIRAcetam (KEPPRA) 750 MG tablet Take 750 mg by mouth 2 (two) times daily.  Marland Kitchen levothyroxine (SYNTHROID, LEVOTHROID) 75 MCG tablet Take 75 mcg by mouth daily.   . metFORMIN (GLUCOPHAGE-XR) 500 MG 24 hr tablet Take 500 mg by mouth 2 (two) times daily.   Marland Kitchen oxybutynin (DITROPAN-XL) 10 MG 24 hr tablet Take 10 mg by mouth at bedtime.  . pioglitazone (ACTOS) 15 MG tablet Take 15 mg by mouth daily.  . potassium chloride (KLOR-CON) 20 MEQ packet Take 20 mEq by mouth daily.  . saxagliptin HCl (ONGLYZA) 5 MG TABS tablet Take 5 mg by mouth daily.  . vitamin B-12 (CYANOCOBALAMIN) 1000 MCG tablet Take 1,000 mcg by mouth daily.     Allergies:   Patient has no known allergies.   Social History   Socioeconomic History  . Marital status: Married    Spouse name: Not on file  . Number of children: Not on file  . Years of education: Not on file  . Highest education level: Not on file  Occupational History  . Not on file  Social Needs  . Financial resource strain: Not on file  . Food insecurity:    Worry: Not on file    Inability: Not on file  . Transportation needs:    Medical: Not on file    Non-medical: Not on file  Tobacco Use  . Smoking status: Never Smoker  . Smokeless tobacco: Never Used  Substance and Sexual Activity  . Alcohol use: Never    Frequency: Never  . Drug use: Never  . Sexual activity: Not on file  Lifestyle  . Physical activity:    Days per week: Not on file    Minutes per session: Not on file  . Stress: Not on file  Relationships  . Social connections:    Talks on phone: Not on file    Gets together: Not on file    Attends religious service: Not on file    Active member of club or organization: Not on file    Attends meetings of clubs or organizations: Not on  file    Relationship status: Not on file  Other Topics Concern  . Not on file  Social History Narrative  . Not on file     Family History: The patient's family history includes Aneurysm in her sister; CAD in her brother; Diabetes in her sister; Heart attack in her mother; Hyperlipidemia in her son; Hypertension in her son; Stroke in her sister. ROS:   Please see the history of present illness.    All other systems reviewed and are negative.  EKGs/Labs/Other Studies Reviewed:    The following studies were reviewed today:  Recent Labs: 05/03/2017: NT-Pro  BNP 1,012 07/06/2017: ALT 48; B Natriuretic Peptide 328.5; TSH 1.629 07/10/2017: Magnesium 1.6 07/11/2017: BUN 32; Creatinine, Ser 1.24; Hemoglobin 8.3; Platelets 82; Potassium 3.5; Sodium 140  Recent Lipid Panel    Component Value Date/Time   CHOL 87 07/07/2017 0459   TRIG 46 07/07/2017 0459   HDL 49 07/07/2017 0459   CHOLHDL 1.8 07/07/2017 0459   VLDL 9 07/07/2017 0459   LDLCALC 29 07/07/2017 0459    Physical Exam:    VS:  Ht 5\' 4"  (1.626 m)   Wt 195 lb (88.5 kg)   BMI 33.47 kg/m     Wt Readings from Last 3 Encounters:  09/07/17 195 lb (88.5 kg)  07/11/17 205 lb 7.5 oz (93.2 kg)     GEN:  Well nourished, well developed in no acute distress HEENT: Normal NECK: No JVD; No carotid bruits LYMPHATICS: No lymphadenopathy CARDIAC: Grade 3/6 mid peaking aortic outflow murmur harsh radiates to right clavicle S2 is still split  RESPIRATORY:  Clear to auscultation without rales, wheezing or rhonchi  ABDOMEN: Soft, non-tender, non-distended MUSCULOSKELETAL:  No edema; No deformity  SKIN: Warm and dry NEUROLOGIC:  Alert and oriented x 3 PSYCHIATRIC:  Normal affect    Signed, Alicia HerrlichBrian Emmaus Brandi, MD  09/07/2017 10:13 AM    [facility]

## 2017-09-07 ENCOUNTER — Encounter: Payer: Self-pay | Admitting: Cardiology

## 2017-09-07 ENCOUNTER — Ambulatory Visit: Payer: Medicare Other | Admitting: Cardiology

## 2017-09-07 VITALS — BP 120/80 | HR 74 | Ht 64.0 in | Wt 195.0 lb

## 2017-09-07 DIAGNOSIS — I350 Nonrheumatic aortic (valve) stenosis: Secondary | ICD-10-CM

## 2017-09-07 DIAGNOSIS — I342 Nonrheumatic mitral (valve) stenosis: Secondary | ICD-10-CM | POA: Diagnosis not present

## 2017-09-07 DIAGNOSIS — I952 Hypotension due to drugs: Secondary | ICD-10-CM | POA: Diagnosis not present

## 2017-09-07 DIAGNOSIS — Z7901 Long term (current) use of anticoagulants: Secondary | ICD-10-CM

## 2017-09-07 DIAGNOSIS — I5032 Chronic diastolic (congestive) heart failure: Secondary | ICD-10-CM | POA: Diagnosis not present

## 2017-09-07 DIAGNOSIS — E118 Type 2 diabetes mellitus with unspecified complications: Secondary | ICD-10-CM

## 2017-09-07 DIAGNOSIS — Z794 Long term (current) use of insulin: Secondary | ICD-10-CM

## 2017-09-07 DIAGNOSIS — I480 Paroxysmal atrial fibrillation: Secondary | ICD-10-CM

## 2017-09-07 LAB — BASIC METABOLIC PANEL
BUN/Creatinine Ratio: 19 (ref 12–28)
BUN: 18 mg/dL (ref 8–27)
CALCIUM: 9.8 mg/dL (ref 8.7–10.3)
CHLORIDE: 99 mmol/L (ref 96–106)
CO2: 28 mmol/L (ref 20–29)
Creatinine, Ser: 0.95 mg/dL (ref 0.57–1.00)
GFR, EST AFRICAN AMERICAN: 65 mL/min/{1.73_m2} (ref >59)
GFR, EST NON AFRICAN AMERICAN: 56 mL/min/{1.73_m2} — AB (ref >59)
Glucose: 76 mg/dL (ref 65–99)
Potassium: 4.2 mmol/L (ref 3.5–5.2)
Sodium: 145 mmol/L — ABNORMAL HIGH (ref 134–144)

## 2017-09-07 LAB — PRO B NATRIURETIC PEPTIDE: NT-Pro BNP: 380 pg/mL (ref 0–738)

## 2017-09-07 MED ORDER — FUROSEMIDE 40 MG PO TABS: 40.0000 mg | ORAL_TABLET | Freq: Every day | ORAL | 3 refills | Status: DC

## 2017-09-07 MED ORDER — APIXABAN 2.5 MG PO TABS: 5.0000 mg | ORAL_TABLET | Freq: Two times a day (BID) | ORAL | 3 refills | Status: DC

## 2017-09-07 MED ORDER — APIXABAN 2.5 MG PO TABS: 2.5000 mg | ORAL_TABLET | Freq: Two times a day (BID) | ORAL | 3 refills | Status: AC

## 2017-09-07 NOTE — Patient Instructions (Addendum)
Medication Instructions:  Your physician has recommended you make the following change in your medication:  CHANGE eliquis to 2.5 mg twice daily  Labwork: Your physician recommends that you have the following labs drawn: BMP and BNP today  Testing/Procedures: None  Follow-Up: Your physician recommends that you schedule a follow-up appointment in: 3 months  Any Other Special Instructions Will Be Listed Below (If Applicable).     If you need a refill on your cardiac medications before your next appointment, please call your pharmacy.   [facility]  Garey HamAshley A, RN, BSN

## 2017-09-11 ENCOUNTER — Telehealth: Payer: Self-pay

## 2017-09-11 NOTE — Telephone Encounter (Signed)
Patient was notified of results.  

## 2017-09-11 NOTE — Telephone Encounter (Signed)
-----   Message from Nicole H Routh, [place] sLamona Curlent at 09/11/2017 10:08 AM EDT -----   ----- Message ----- From: Baldo DaubMunley, Brian J, MD Sent: 09/07/2017   5:28 PM To: Lamona CurlNicole H Routh, CMA  Normal or stable result

## 2017-10-17 ENCOUNTER — Other Ambulatory Visit: Payer: Self-pay | Admitting: Cardiology

## 2017-10-17 MED ORDER — FUROSEMIDE 40 MG PO TABS: 40.0000 mg | ORAL_TABLET | Freq: Every day | ORAL | 1 refills | Status: AC

## 2017-10-17 NOTE — Telephone Encounter (Signed)
Will consult with Dr.Munley regarding medications instructions.

## 2017-10-17 NOTE — Telephone Encounter (Signed)
Mediation refilled. Verified medication instructions with Dr. Dulce Sellar.

## 2017-10-17 NOTE — Telephone Encounter (Signed)
°*  STAT* If patient is at the pharmacy, call can be transferred to refill team.   1. Which medications need to be refilled? (please list name of each medication and dose if known) Furosemide with the correct directions please.   2. Which pharmacy/location (including street and city if local pharmacy) is medication to be sent to? [facility]   3. Do they need a 30 day or 90 day supply? 30

## 2017-10-24 DIAGNOSIS — I5030 Unspecified diastolic (congestive) heart failure: Secondary | ICD-10-CM | POA: Diagnosis not present

## 2017-10-24 DIAGNOSIS — N390 Urinary tract infection, site not specified: Secondary | ICD-10-CM | POA: Diagnosis not present

## 2017-10-24 DIAGNOSIS — R680 Hypothermia, not associated with low environmental temperature: Secondary | ICD-10-CM

## 2017-10-24 DIAGNOSIS — I440 Atrioventricular block, first degree: Secondary | ICD-10-CM

## 2017-10-24 DIAGNOSIS — A419 Sepsis, unspecified organism: Secondary | ICD-10-CM | POA: Diagnosis not present

## 2017-10-25 DIAGNOSIS — A419 Sepsis, unspecified organism: Secondary | ICD-10-CM | POA: Diagnosis not present

## 2017-10-25 DIAGNOSIS — I5030 Unspecified diastolic (congestive) heart failure: Secondary | ICD-10-CM | POA: Diagnosis not present

## 2017-10-25 DIAGNOSIS — R680 Hypothermia, not associated with low environmental temperature: Secondary | ICD-10-CM | POA: Diagnosis not present

## 2017-10-25 DIAGNOSIS — N390 Urinary tract infection, site not specified: Secondary | ICD-10-CM | POA: Diagnosis not present

## 2017-10-26 DIAGNOSIS — N390 Urinary tract infection, site not specified: Secondary | ICD-10-CM | POA: Diagnosis not present

## 2017-10-26 DIAGNOSIS — I5030 Unspecified diastolic (congestive) heart failure: Secondary | ICD-10-CM | POA: Diagnosis not present

## 2017-10-26 DIAGNOSIS — A419 Sepsis, unspecified organism: Secondary | ICD-10-CM | POA: Diagnosis not present

## 2017-10-26 DIAGNOSIS — R680 Hypothermia, not associated with low environmental temperature: Secondary | ICD-10-CM | POA: Diagnosis not present

## 2017-10-27 DIAGNOSIS — I5030 Unspecified diastolic (congestive) heart failure: Secondary | ICD-10-CM | POA: Diagnosis not present

## 2017-10-27 DIAGNOSIS — N390 Urinary tract infection, site not specified: Secondary | ICD-10-CM | POA: Diagnosis not present

## 2017-10-27 DIAGNOSIS — R680 Hypothermia, not associated with low environmental temperature: Secondary | ICD-10-CM | POA: Diagnosis not present

## 2017-10-27 DIAGNOSIS — A419 Sepsis, unspecified organism: Secondary | ICD-10-CM | POA: Diagnosis not present

## 2017-11-03 DIAGNOSIS — R001 Bradycardia, unspecified: Secondary | ICD-10-CM | POA: Diagnosis not present

## 2017-11-03 DIAGNOSIS — T68XXXA Hypothermia, initial encounter: Secondary | ICD-10-CM

## 2017-11-04 DIAGNOSIS — R001 Bradycardia, unspecified: Secondary | ICD-10-CM | POA: Diagnosis not present

## 2017-11-04 DIAGNOSIS — T68XXXA Hypothermia, initial encounter: Secondary | ICD-10-CM | POA: Diagnosis not present

## 2017-11-20 DIAGNOSIS — R29818 Other symptoms and signs involving the nervous system: Secondary | ICD-10-CM | POA: Diagnosis not present

## 2017-11-20 DIAGNOSIS — T68XXXA Hypothermia, initial encounter: Secondary | ICD-10-CM | POA: Diagnosis not present

## 2017-11-21 DIAGNOSIS — T68XXXA Hypothermia, initial encounter: Secondary | ICD-10-CM | POA: Diagnosis not present

## 2017-11-21 DIAGNOSIS — R29818 Other symptoms and signs involving the nervous system: Secondary | ICD-10-CM | POA: Diagnosis not present

## 2017-11-25 DIAGNOSIS — I5030 Unspecified diastolic (congestive) heart failure: Secondary | ICD-10-CM

## 2017-11-25 DIAGNOSIS — R4182 Altered mental status, unspecified: Secondary | ICD-10-CM

## 2017-11-25 DIAGNOSIS — J690 Pneumonitis due to inhalation of food and vomit: Secondary | ICD-10-CM | POA: Diagnosis not present

## 2017-11-25 DIAGNOSIS — J9601 Acute respiratory failure with hypoxia: Secondary | ICD-10-CM

## 2017-11-25 DIAGNOSIS — E1169 Type 2 diabetes mellitus with other specified complication: Secondary | ICD-10-CM

## 2017-11-25 DIAGNOSIS — E039 Hypothyroidism, unspecified: Secondary | ICD-10-CM

## 2017-11-25 DIAGNOSIS — I4891 Unspecified atrial fibrillation: Secondary | ICD-10-CM

## 2017-11-25 DIAGNOSIS — I10 Essential (primary) hypertension: Secondary | ICD-10-CM

## 2017-11-25 DIAGNOSIS — R339 Retention of urine, unspecified: Secondary | ICD-10-CM | POA: Diagnosis not present

## 2017-11-25 DIAGNOSIS — Z87898 Personal history of other specified conditions: Secondary | ICD-10-CM

## 2017-11-26 DIAGNOSIS — J690 Pneumonitis due to inhalation of food and vomit: Secondary | ICD-10-CM | POA: Diagnosis not present

## 2017-11-26 DIAGNOSIS — E039 Hypothyroidism, unspecified: Secondary | ICD-10-CM | POA: Diagnosis not present

## 2017-11-26 DIAGNOSIS — Z87898 Personal history of other specified conditions: Secondary | ICD-10-CM | POA: Diagnosis not present

## 2017-11-26 DIAGNOSIS — R339 Retention of urine, unspecified: Secondary | ICD-10-CM | POA: Diagnosis not present

## 2017-11-27 DIAGNOSIS — J690 Pneumonitis due to inhalation of food and vomit: Secondary | ICD-10-CM | POA: Diagnosis not present

## 2017-11-27 DIAGNOSIS — E039 Hypothyroidism, unspecified: Secondary | ICD-10-CM | POA: Diagnosis not present

## 2017-11-27 DIAGNOSIS — R339 Retention of urine, unspecified: Secondary | ICD-10-CM | POA: Diagnosis not present

## 2017-11-27 DIAGNOSIS — Z87898 Personal history of other specified conditions: Secondary | ICD-10-CM | POA: Diagnosis not present

## 2017-11-28 DIAGNOSIS — Z87898 Personal history of other specified conditions: Secondary | ICD-10-CM | POA: Diagnosis not present

## 2017-11-28 DIAGNOSIS — E039 Hypothyroidism, unspecified: Secondary | ICD-10-CM | POA: Diagnosis not present

## 2017-11-28 DIAGNOSIS — J690 Pneumonitis due to inhalation of food and vomit: Secondary | ICD-10-CM | POA: Diagnosis not present

## 2017-11-28 DIAGNOSIS — R339 Retention of urine, unspecified: Secondary | ICD-10-CM | POA: Diagnosis not present

## 2017-11-29 DIAGNOSIS — J690 Pneumonitis due to inhalation of food and vomit: Secondary | ICD-10-CM | POA: Diagnosis not present

## 2017-11-29 DIAGNOSIS — R339 Retention of urine, unspecified: Secondary | ICD-10-CM | POA: Diagnosis not present

## 2017-11-29 DIAGNOSIS — Z87898 Personal history of other specified conditions: Secondary | ICD-10-CM | POA: Diagnosis not present

## 2017-11-29 DIAGNOSIS — E039 Hypothyroidism, unspecified: Secondary | ICD-10-CM | POA: Diagnosis not present

## 2017-11-30 DIAGNOSIS — J690 Pneumonitis due to inhalation of food and vomit: Secondary | ICD-10-CM | POA: Diagnosis not present

## 2017-11-30 DIAGNOSIS — Z87898 Personal history of other specified conditions: Secondary | ICD-10-CM | POA: Diagnosis not present

## 2017-11-30 DIAGNOSIS — E039 Hypothyroidism, unspecified: Secondary | ICD-10-CM | POA: Diagnosis not present

## 2017-11-30 DIAGNOSIS — R339 Retention of urine, unspecified: Secondary | ICD-10-CM | POA: Diagnosis not present

## 2017-12-01 DIAGNOSIS — R339 Retention of urine, unspecified: Secondary | ICD-10-CM | POA: Diagnosis not present

## 2017-12-01 DIAGNOSIS — E039 Hypothyroidism, unspecified: Secondary | ICD-10-CM | POA: Diagnosis not present

## 2017-12-01 DIAGNOSIS — Z87898 Personal history of other specified conditions: Secondary | ICD-10-CM | POA: Diagnosis not present

## 2017-12-01 DIAGNOSIS — J690 Pneumonitis due to inhalation of food and vomit: Secondary | ICD-10-CM | POA: Diagnosis not present

## 2017-12-03 DIAGNOSIS — I5030 Unspecified diastolic (congestive) heart failure: Secondary | ICD-10-CM

## 2017-12-03 DIAGNOSIS — E119 Type 2 diabetes mellitus without complications: Secondary | ICD-10-CM | POA: Diagnosis not present

## 2017-12-03 DIAGNOSIS — R4182 Altered mental status, unspecified: Secondary | ICD-10-CM

## 2017-12-03 DIAGNOSIS — I4891 Unspecified atrial fibrillation: Secondary | ICD-10-CM

## 2017-12-03 DIAGNOSIS — F0390 Unspecified dementia without behavioral disturbance: Secondary | ICD-10-CM | POA: Diagnosis not present

## 2017-12-03 DIAGNOSIS — Z87898 Personal history of other specified conditions: Secondary | ICD-10-CM

## 2017-12-03 DIAGNOSIS — E039 Hypothyroidism, unspecified: Secondary | ICD-10-CM

## 2017-12-04 DIAGNOSIS — F0390 Unspecified dementia without behavioral disturbance: Secondary | ICD-10-CM | POA: Diagnosis not present

## 2017-12-04 DIAGNOSIS — R4182 Altered mental status, unspecified: Secondary | ICD-10-CM | POA: Diagnosis not present

## 2019-12-09 IMAGING — DX DG CHEST 1V PORT
1 series · 1 of 1 positions shown · non-contrast
Comparison: Chest radiograph performed 07/04/2017

CLINICAL DATA: Acute onset of shortness of breath. PICC placement.

EXAM:
PORTABLE CHEST 1 VIEW

[chest]
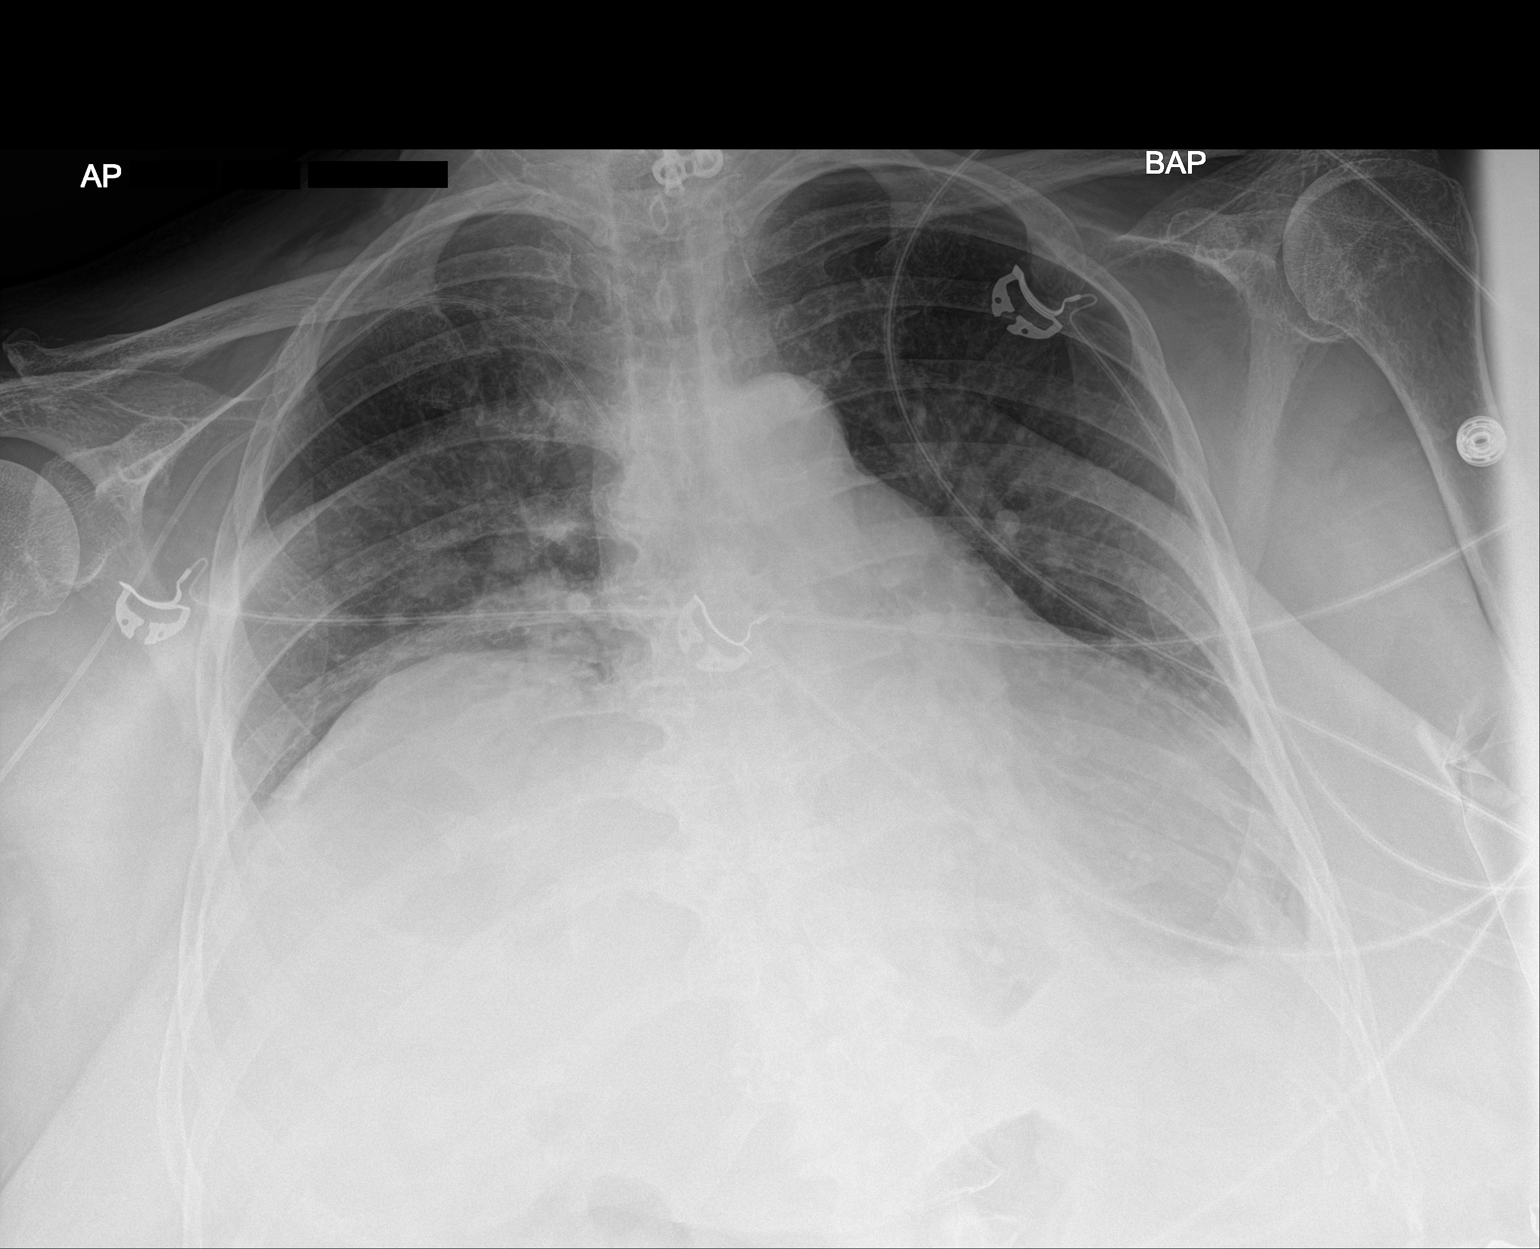

[1 of 1 positions shown; findings below may reference images not displayed]

FINDINGS: There is elevation of the right hemidiaphragm. Bibasilar airspace
opacities may reflect atelectasis or pneumonia. Small bilateral
pleural effusions are suspected. There is no evidence of
pneumothorax.

The cardiomediastinal silhouette is borderline enlarged. Cervical
spinal fusion hardware is noted. No acute osseous abnormalities are
seen.
IMPRESSION: 1. Elevation of the right hemidiaphragm. Bibasilar airspace
opacities may reflect atelectasis or pneumonia. Small bilateral
pleural effusions suspected.
2. Borderline cardiomegaly.
